# Patient Record
Sex: Male | Born: 1960
Health system: Southern US, Community
[De-identification: ages and names within clinical notes are randomized; demographics above are authoritative.]

## PROBLEM LIST (undated history)

## (undated) DIAGNOSIS — R7989 Other specified abnormal findings of blood chemistry: Secondary | ICD-10-CM

## (undated) DIAGNOSIS — IMO0001 Reserved for inherently not codable concepts without codable children: Secondary | ICD-10-CM

## (undated) DIAGNOSIS — I10 Essential (primary) hypertension: Secondary | ICD-10-CM

## (undated) DIAGNOSIS — M1990 Unspecified osteoarthritis, unspecified site: Secondary | ICD-10-CM

## (undated) DIAGNOSIS — E7800 Pure hypercholesterolemia, unspecified: Secondary | ICD-10-CM

## (undated) DIAGNOSIS — I2510 Atherosclerotic heart disease of native coronary artery without angina pectoris: Secondary | ICD-10-CM

## (undated) DIAGNOSIS — M109 Gout, unspecified: Secondary | ICD-10-CM

## (undated) DIAGNOSIS — G4730 Sleep apnea, unspecified: Secondary | ICD-10-CM

## (undated) HISTORY — PX: COLONOSCOPY W/ POLYPECTOMY: SHX1380

## (undated) HISTORY — PX: TONSILLECTOMY: SUR1361

## (undated) HISTORY — PX: INSERT / REPLACE / REMOVE PACEMAKER: SUR710

## (undated) HISTORY — PX: UVULOPALATOPHARYNGOPLASTY: SHX827

## (undated) HISTORY — PX: CHOLECYSTECTOMY: SHX55

---

## 1994-02-09 HISTORY — PX: HAND SURGERY: SHX662

## 2003-05-31 ENCOUNTER — Other Ambulatory Visit: Payer: Self-pay

## 2004-01-04 ENCOUNTER — Other Ambulatory Visit: Payer: Self-pay

## 2004-01-09 ENCOUNTER — Inpatient Hospital Stay: Payer: Self-pay | Admitting: Otolaryngology

## 2004-04-16 ENCOUNTER — Ambulatory Visit: Payer: Self-pay | Admitting: Otolaryngology

## 2004-07-17 ENCOUNTER — Emergency Department: Payer: Self-pay | Admitting: General Practice

## 2004-07-18 ENCOUNTER — Inpatient Hospital Stay: Payer: Self-pay | Admitting: Surgery

## 2004-07-25 ENCOUNTER — Ambulatory Visit: Payer: Self-pay | Admitting: Surgery

## 2004-08-21 ENCOUNTER — Ambulatory Visit: Payer: Self-pay | Admitting: Surgery

## 2004-08-26 ENCOUNTER — Other Ambulatory Visit: Payer: Self-pay

## 2004-08-28 ENCOUNTER — Ambulatory Visit: Payer: Self-pay | Admitting: Surgery

## 2005-03-22 ENCOUNTER — Other Ambulatory Visit: Payer: Self-pay

## 2005-03-22 ENCOUNTER — Emergency Department: Payer: Self-pay | Admitting: General Practice

## 2005-10-20 ENCOUNTER — Ambulatory Visit: Payer: Self-pay | Admitting: Internal Medicine

## 2005-12-17 ENCOUNTER — Ambulatory Visit: Payer: Self-pay | Admitting: Internal Medicine

## 2006-07-30 ENCOUNTER — Ambulatory Visit: Payer: Self-pay | Admitting: Internal Medicine

## 2007-06-27 ENCOUNTER — Ambulatory Visit: Payer: Self-pay | Admitting: Gastroenterology

## 2008-09-27 ENCOUNTER — Ambulatory Visit: Payer: Self-pay | Admitting: Internal Medicine

## 2010-03-13 ENCOUNTER — Ambulatory Visit: Payer: Self-pay

## 2012-05-20 ENCOUNTER — Ambulatory Visit: Payer: Self-pay | Admitting: Gastroenterology

## 2012-07-26 ENCOUNTER — Ambulatory Visit: Payer: Self-pay | Admitting: Physical Medicine and Rehabilitation

## 2012-12-21 ENCOUNTER — Ambulatory Visit: Payer: Self-pay | Admitting: Internal Medicine

## 2013-01-19 ENCOUNTER — Other Ambulatory Visit: Payer: Self-pay | Admitting: Neurosurgery

## 2013-01-19 DIAGNOSIS — M48061 Spinal stenosis, lumbar region without neurogenic claudication: Secondary | ICD-10-CM

## 2013-01-27 ENCOUNTER — Ambulatory Visit
Admission: RE | Admit: 2013-01-27 | Discharge: 2013-01-27 | Disposition: A | Discharge status: Home / Self Care | Payer: Managed Care, Other (non HMO) | Source: Ambulatory Visit | Attending: Neurosurgery | Admitting: Neurosurgery

## 2013-01-27 VITALS — BP 131/66 | HR 61

## 2013-01-27 DIAGNOSIS — M48061 Spinal stenosis, lumbar region without neurogenic claudication: Secondary | ICD-10-CM

## 2013-01-27 MED ORDER — IOHEXOL 180 MG/ML  SOLN
18.0000 mL | Freq: Once | INTRAMUSCULAR | Status: AC | PRN
  Administered 2013-01-27: 18 mL via INTRATHECAL

## 2013-01-27 MED ORDER — DIAZEPAM 5 MG PO TABS
10.0000 mg | ORAL_TABLET | Freq: Once | ORAL | Status: AC
  Administered 2013-01-27: 10 mg via ORAL

## 2013-01-27 MED ORDER — ONDANSETRON HCL 4 MG/2ML IJ SOLN: 4.0000 mg | Freq: Four times a day (QID) | INTRAMUSCULAR | Status: DC | PRN

## 2013-06-30 ENCOUNTER — Emergency Department: Payer: Self-pay | Admitting: Emergency Medicine

## 2013-06-30 LAB — CBC
HCT: 43.3 % (ref 40.0–52.0)
HGB: 15 g/dL (ref 13.0–18.0)
MCH: 32.4 pg (ref 26.0–34.0)
MCHC: 34.8 g/dL (ref 32.0–36.0)
MCV: 93 fL (ref 80–100)
Platelet: 211 10*3/uL (ref 150–440)
RBC: 4.64 10*6/uL (ref 4.40–5.90)
RDW: 13.4 % (ref 11.5–14.5)
WBC: 8.3 10*3/uL (ref 3.8–10.6)

## 2013-06-30 LAB — COMPREHENSIVE METABOLIC PANEL
ALK PHOS: 74 U/L
Albumin: 4.2 g/dL (ref 3.4–5.0)
Anion Gap: 9 (ref 7–16)
BUN: 9 mg/dL (ref 7–18)
Bilirubin,Total: 0.7 mg/dL (ref 0.2–1.0)
CHLORIDE: 102 mmol/L (ref 98–107)
CO2: 25 mmol/L (ref 21–32)
CREATININE: 0.84 mg/dL (ref 0.60–1.30)
Calcium, Total: 9.2 mg/dL (ref 8.5–10.1)
EGFR (Non-African Amer.): >60
Glucose: 120 mg/dL — ABNORMAL HIGH (ref 65–99)
Osmolality: 272 (ref 275–301)
Potassium: 4.1 mmol/L (ref 3.5–5.1)
SGOT(AST): 34 U/L (ref 15–37)
SGPT (ALT): 52 U/L (ref 12–78)
Sodium: 136 mmol/L (ref 136–145)
Total Protein: 8 g/dL (ref 6.4–8.2)

## 2013-06-30 LAB — PRO B NATRIURETIC PEPTIDE: B-TYPE NATIURETIC PEPTID: 27 pg/mL (ref 0–125)

## 2013-06-30 LAB — CK: CK, TOTAL: 166 U/L

## 2013-06-30 LAB — TROPONIN I: Troponin-I: <0.02 ng/mL

## 2015-03-11 ENCOUNTER — Other Ambulatory Visit: Payer: Self-pay | Admitting: Internal Medicine

## 2015-03-11 DIAGNOSIS — R0609 Other forms of dyspnea: Secondary | ICD-10-CM

## 2015-03-14 ENCOUNTER — Ambulatory Visit
Admission: RE | Admit: 2015-03-14 | Discharge: 2015-03-14 | Disposition: A | Discharge status: Home / Self Care | Payer: Commercial Managed Care - HMO | Source: Ambulatory Visit | Attending: Internal Medicine | Admitting: Internal Medicine

## 2015-03-14 DIAGNOSIS — I2510 Atherosclerotic heart disease of native coronary artery without angina pectoris: Secondary | ICD-10-CM | POA: Diagnosis not present

## 2015-03-14 DIAGNOSIS — I517 Cardiomegaly: Secondary | ICD-10-CM | POA: Diagnosis not present

## 2015-03-14 DIAGNOSIS — R918 Other nonspecific abnormal finding of lung field: Secondary | ICD-10-CM | POA: Insufficient documentation

## 2015-03-14 DIAGNOSIS — R0609 Other forms of dyspnea: Secondary | ICD-10-CM

## 2015-03-14 DIAGNOSIS — Z87891 Personal history of nicotine dependence: Secondary | ICD-10-CM | POA: Diagnosis not present

## 2015-03-14 HISTORY — DX: Essential (primary) hypertension: I10

## 2015-03-14 MED ORDER — IOHEXOL 350 MG/ML SOLN
100.0000 mL | Freq: Once | INTRAVENOUS | Status: AC | PRN
  Administered 2015-03-14: 100 mL via INTRAVENOUS

## 2015-03-15 DIAGNOSIS — Q245 Malformation of coronary vessels: Secondary | ICD-10-CM | POA: Insufficient documentation

## 2015-03-17 ENCOUNTER — Emergency Department: Payer: Commercial Managed Care - HMO

## 2015-03-17 ENCOUNTER — Emergency Department
Admission: EM | Admit: 2015-03-17 | Discharge: 2015-03-17 | Disposition: A | Discharge status: Home / Self Care | Payer: Commercial Managed Care - HMO | Attending: Emergency Medicine | Admitting: Emergency Medicine

## 2015-03-17 ENCOUNTER — Encounter: Payer: Self-pay | Admitting: Emergency Medicine

## 2015-03-17 DIAGNOSIS — R079 Chest pain, unspecified: Secondary | ICD-10-CM

## 2015-03-17 DIAGNOSIS — I10 Essential (primary) hypertension: Secondary | ICD-10-CM | POA: Insufficient documentation

## 2015-03-17 DIAGNOSIS — Z87891 Personal history of nicotine dependence: Secondary | ICD-10-CM | POA: Insufficient documentation

## 2015-03-17 DIAGNOSIS — R0602 Shortness of breath: Secondary | ICD-10-CM | POA: Diagnosis not present

## 2015-03-17 DIAGNOSIS — I2510 Atherosclerotic heart disease of native coronary artery without angina pectoris: Secondary | ICD-10-CM | POA: Diagnosis not present

## 2015-03-17 DIAGNOSIS — I519 Heart disease, unspecified: Secondary | ICD-10-CM | POA: Insufficient documentation

## 2015-03-17 HISTORY — DX: Pure hypercholesterolemia, unspecified: E78.00

## 2015-03-17 HISTORY — DX: Atherosclerotic heart disease of native coronary artery without angina pectoris: I25.10

## 2015-03-17 LAB — TROPONIN I: Troponin I: <0.03 ng/mL (ref <0.031)

## 2015-03-17 LAB — BASIC METABOLIC PANEL
Anion gap: 10 (ref 5–15)
BUN: 14 mg/dL (ref 6–20)
CHLORIDE: 102 mmol/L (ref 101–111)
CO2: 23 mmol/L (ref 22–32)
Calcium: 8.6 mg/dL — ABNORMAL LOW (ref 8.9–10.3)
Creatinine, Ser: 0.87 mg/dL (ref 0.61–1.24)
GFR calc non Af Amer: >60 mL/min (ref >60)
Glucose, Bld: 164 mg/dL — ABNORMAL HIGH (ref 65–99)
Potassium: 3.3 mmol/L — ABNORMAL LOW (ref 3.5–5.1)
SODIUM: 135 mmol/L (ref 135–145)

## 2015-03-17 LAB — CBC
HCT: 39.9 % — ABNORMAL LOW (ref 40.0–52.0)
Hemoglobin: 13.7 g/dL (ref 13.0–18.0)
MCH: 31.7 pg (ref 26.0–34.0)
MCHC: 34.4 g/dL (ref 32.0–36.0)
MCV: 92 fL (ref 80.0–100.0)
Platelets: 179 10*3/uL (ref 150–440)
RBC: 4.33 MIL/uL — AB (ref 4.40–5.90)
RDW: 13.3 % (ref 11.5–14.5)
WBC: 6.6 10*3/uL (ref 3.8–10.6)

## 2015-03-17 MED ORDER — ISOSORBIDE MONONITRATE ER 30 MG PO TB24: 30.0000 mg | ORAL_TABLET | Freq: Every day | ORAL | Status: DC

## 2015-03-17 MED ORDER — NITROGLYCERIN 0.4 MG SL SUBL
0.4000 mg | SUBLINGUAL_TABLET | SUBLINGUAL | Status: DC | PRN
  Administered 2015-03-17 (×3): 0.4 mg via SUBLINGUAL
  Filled 2015-03-17 (×3): qty 1

## 2015-03-17 MED ORDER — SODIUM CHLORIDE 0.9 % IV BOLUS (SEPSIS)
1000.0000 mL | Freq: Once | INTRAVENOUS | Status: AC
  Administered 2015-03-17: 1000 mL via INTRAVENOUS

## 2015-03-17 MED ORDER — ASPIRIN 81 MG PO CHEW
324.0000 mg | CHEWABLE_TABLET | Freq: Once | ORAL | Status: AC
  Administered 2015-03-17: 324 mg via ORAL
  Filled 2015-03-17: qty 4

## 2015-03-17 NOTE — ED Notes (Signed)
Pt says he's had respiratory issues for about 2 weeks; was called Friday by his MD and told he has CAD; pt presents tonight with pain to his left chest that started about 1 hour ago; says when he turned to his side the pain moved to the center of his chest; nausea but no vomiting; short of breath all the time, worse with ambulation

## 2015-03-17 NOTE — ED Notes (Signed)
Patient returned from XR. 

## 2015-03-17 NOTE — Discharge Instructions (Signed)
Nonspecific Chest Pain  °Chest pain can be caused by many different conditions. There is always a chance that your pain could be related to something serious, such as a heart attack or a blood clot in your lungs. Chest pain can also be caused by conditions that are not life-threatening. If you have chest pain, it is very important to follow up with your health care provider. °CAUSES  °Chest pain can be caused by: °· Heartburn. °· Pneumonia or bronchitis. °· Anxiety or stress. °· Inflammation around your heart (pericarditis) or lung (pleuritis or pleurisy). °· A blood clot in your lung. °· A collapsed lung (pneumothorax). It can develop suddenly on its own (spontaneous pneumothorax) or from trauma to the chest. °· Shingles infection (varicella-zoster virus). °· Heart attack. °· Damage to the bones, muscles, and cartilage that make up your chest wall. This can include: °¨ Bruised bones due to injury. °¨ Strained muscles or cartilage due to frequent or repeated coughing or overwork. °¨ Fracture to one or more ribs. °¨ Sore cartilage due to inflammation (costochondritis). °RISK FACTORS  °Risk factors for chest pain may include: °· Activities that increase your risk for trauma or injury to your chest. °· Respiratory infections or conditions that cause frequent coughing. °· Medical conditions or overeating that can cause heartburn. °· Heart disease or family history of heart disease. °· Conditions or health behaviors that increase your risk of developing a blood clot. °· Having had chicken pox (varicella zoster). °SIGNS AND SYMPTOMS °Chest pain can feel like: °· Burning or tingling on the surface of your chest or deep in your chest. °· Crushing, pressure, aching, or squeezing pain. °· Dull or sharp pain that is worse when you move, cough, or take a deep breath. °· Pain that is also felt in your back, neck, shoulder, or arm, or pain that spreads to any of these areas. °Your chest pain may come and go, or it may stay  constant. °DIAGNOSIS °Lab tests or other studies may be needed to find the cause of your pain. Your health care provider may have you take a test called an ambulatory ECG (electrocardiogram). An ECG records your heartbeat patterns at the time the test is performed. You may also have other tests, such as: °· Transthoracic echocardiogram (TTE). During echocardiography, sound waves are used to create a picture of all of the heart structures and to look at how blood flows through your heart. °· Transesophageal echocardiogram (TEE). This is a more advanced imaging test that obtains images from inside your body. It allows your health care provider to see your heart in finer detail. °· Cardiac monitoring. This allows your health care provider to monitor your heart rate and rhythm in real time. °· Holter monitor. This is a portable device that records your heartbeat and can help to diagnose abnormal heartbeats. It allows your health care provider to track your heart activity for several days, if needed. °· Stress tests. These can be done through exercise or by taking medicine that makes your heart beat more quickly. °· Blood tests. °· Imaging tests. °TREATMENT  °Your treatment depends on what is causing your chest pain. Treatment may include: °· Medicines. These may include: °¨ Acid blockers for heartburn. °¨ Anti-inflammatory medicine. °¨ Pain medicine for inflammatory conditions. °¨ Antibiotic medicine, if an infection is present. °¨ Medicines to dissolve blood clots. °¨ Medicines to treat coronary artery disease. °· Supportive care for conditions that do not require medicines. This may include: °¨ Resting. °¨ Applying heat   or cold packs to injured areas. °¨ Limiting activities until pain decreases. °HOME CARE INSTRUCTIONS °· If you were prescribed an antibiotic medicine, finish it all even if you start to feel better. °· Avoid any activities that bring on chest pain. °· Do not use any tobacco products, including  cigarettes, chewing tobacco, or electronic cigarettes. If you need help quitting, ask your health care provider. °· Do not drink alcohol. °· Take medicines only as directed by your health care provider. °· Keep all follow-up visits as directed by your health care provider. This is important. This includes any further testing if your chest pain does not go away. °· If heartburn is the cause for your chest pain, you may be told to keep your head raised (elevated) while sleeping. This reduces the chance that acid will go from your stomach into your esophagus. °· Make lifestyle changes as directed by your health care provider. These may include: °¨ Getting regular exercise. Ask your health care provider to suggest some activities that are safe for you. °¨ Eating a heart-healthy diet. A registered dietitian can help you to learn healthy eating options. °¨ Maintaining a healthy weight. °¨ Managing diabetes, if necessary. °¨ Reducing stress. °SEEK MEDICAL CARE IF: °· Your chest pain does not go away after treatment. °· You have a rash with blisters on your chest. °· You have a fever. °SEEK IMMEDIATE MEDICAL CARE IF:  °· Your chest pain is worse. °· You have an increasing cough, or you cough up blood. °· You have severe abdominal pain. °· You have severe weakness. °· You faint. °· You have chills. °· You have sudden, unexplained chest discomfort. °· You have sudden, unexplained discomfort in your arms, back, neck, or jaw. °· You have shortness of breath at any time. °· You suddenly start to sweat, or your skin gets clammy. °· You feel nauseous or you vomit. °· You suddenly feel light-headed or dizzy. °· Your heart begins to beat quickly, or it feels like it is skipping beats. °These symptoms may represent a serious problem that is an emergency. Do not wait to see if the symptoms will go away. Get medical help right away. Call your local emergency services (911 in the [place] Do not drive yourself to the hospital. °  °This  information is not intended to replace advice given to you by your health care provider. Make sure you discuss any questions you have with your health care provider. °  °Document Released: 11/05/2004 Document Revised: 02/16/2014 Document Reviewed: 09/01/2013 °Elsevier Interactive Patient Education ©2016 [employer] ° °

## 2015-03-17 NOTE — ED Notes (Signed)
Patient in distress and increased WOB, placing in right AC.

## 2015-03-17 NOTE — ED Notes (Signed)
Patient transported to X-ray 

## 2015-03-17 NOTE — ED Provider Notes (Signed)
[facility] Emergency Department Provider Note  ____________________________________________  Time seen: Approximately 353 AM  I have reviewed the triage vital signs and the nursing notes.   HISTORY  Chief Complaint Chest Pain    HPI Jacob Rose is a 55 y.o. male who comes into the hospital today with chest pain. The patient reports he woke up about 2 AM and felt nauseous and felt as though he couldn't breathe. He rolled onto his left side and developed some middle chest pain. He reports that he was told by his doctor that he had coronary heart disease and he needs to see Dr. Satira Mccallum. He reports that he has been having some chest pressure for multiple days and shortness of breath for 2 weeks which prompted his doctor's visit. The patient reports that he does not wear oxygen at home. He still having some chest pressure he reports a 7 out of 10 in intensity. He did not take any medications at home for this pain but did take his home normal medicines before he went to bed. The patient has some left arm radiation, shortness of breath, nausea with no dizziness or lightheadedness. The patient did not tolerate the symptoms and he decided to come in and get checked out.   Past Medical History  Diagnosis Date  . Hypertension   . Coronary artery disease   . Hypercholesteremia     There are no active problems to display for this patient.   Past Surgical History  Procedure Laterality Date  . Hand surgery Left   . Uvulopalatopharyngoplasty    . Tonsillectomy      Current Outpatient Rx  Name  Route  Sig  Dispense  Refill  . isosorbide mononitrate (IMDUR) 30 MG 24 hr tablet   Oral   Take 1 tablet (30 mg total) by mouth daily.   15 tablet   0     Allergies Review of patient's allergies indicates no known allergies.  History reviewed. No pertinent family history.  Social History Social History  Substance Use Topics  . Smoking status: Former Research scientist (life sciences)  .  Smokeless tobacco: Current User    Types: Chew  . Alcohol Use: Yes     Comment: 6 pack last night    Review of Systems Constitutional: No fever/chills Eyes: No visual changes. ENT: No sore throat. Cardiovascular:  chest pain. Respiratory: shortness of breath. Gastrointestinal: Nausea with No abdominal pain.  no vomiting.  No diarrhea.  No constipation. Genitourinary: Negative for dysuria. Musculoskeletal: Negative for back pain. Skin: Negative for rash. Neurological: Negative for headaches, focal weakness or numbness.  10-point ROS otherwise negative.  ____________________________________________   PHYSICAL EXAM:  VITAL SIGNS: ED Triage Vitals  Enc Vitals Group     BP 03/17/15 0326 142/79 mmHg     Pulse Rate 03/17/15 0326 85     Resp 03/17/15 0326 24     Temp 03/17/15 0326 97.5 F (36.4 C)     Temp Source 03/17/15 0326 Oral     SpO2 03/17/15 0326 100 %     Weight 03/17/15 0326 223 lb (101.152 kg)     Height 03/17/15 0326 5\' 6"  (1.676 m)     Head Cir --      Peak Flow --      Pain Score 03/17/15 0326 7     Pain Loc --      Pain Edu? --      Excl. in [place]? --     Constitutional: Alert and  oriented. Well appearing and in moderate distress. Eyes: Conjunctivae are normal. PERRL. EOMI. Head: Atraumatic. Nose: No congestion/rhinnorhea. Mouth/Throat: Mucous membranes are moist.  Oropharynx non-erythematous. Cardiovascular: Normal rate, regular rhythm. Grossly normal heart sounds.  Good peripheral circulation. Respiratory: Normal respiratory effort.  No retractions. Lungs CTAB. Gastrointestinal: Soft and nontender. No distention. Positive bowel sounds Musculoskeletal: No lower extremity tenderness nor edema.   Neurologic:  Normal speech and language.  Skin:  Skin is warm, dry and intact.  Psychiatric: Mood and affect are normal.   ____________________________________________   LABS (all labs ordered are listed, but only abnormal results are displayed)  Labs  Reviewed  BASIC METABOLIC PANEL - Abnormal; Notable for the following:    Potassium 3.3 (*)    Glucose, Bld 164 (*)    Calcium 8.6 (*)    All other components within normal limits  CBC - Abnormal; Notable for the following:    RBC 4.33 (*)    HCT 39.9 (*)    All other components within normal limits  TROPONIN I  TROPONIN I   ____________________________________________  EKG  ED ECG REPORT I, Loney Hering, the attending physician, personally viewed and interpreted this ECG.   Date: 03/17/2015  EKG Time: 322  Rate: 79  Rhythm: normal sinus rhythm  Axis: Normal  Intervals:none  ST&T Change: None  ____________________________________________  RADIOLOGY  Chest x-ray: No evidence of active disease ____________________________________________   PROCEDURES  Procedure(s) performed: None  Critical Care performed: No  ____________________________________________   INITIAL IMPRESSION / ASSESSMENT AND PLAN / ED COURSE  Pertinent labs & imaging results that were available during my care of the patient were reviewed by me and considered in my medical decision making (see chart for details).  This is a 55 year old male who comes into the hospital today with some chest pain and shortness of breath. The patient reports that he was told he does have some coronary artery disease. I will give the patient some nitroglycerin and normal saline as he is continuing to have chest pain. I will also give the patient some aspirin. I will reassess the patient once I received all the results of his blood work.  The patient's chest pain resolved after 2 nitroglycerin. The patient's cardiac enzymes were unremarkable 2 doses. He continues to have no chest pain at this time. I contacted Dr.Paraschos who was the cardiologist on-call and asked if Imdur would be an appropriate medication to help this patient's chest pain. He reports that he feels it is appropriate to give the patient Imdur and  to have him follow-up in the office this week. I expect this to the patient and he also agrees the plan. The patient be discharged home to follow-up with cardiology. ____________________________________________   FINAL CLINICAL IMPRESSION(S) / ED DIAGNOSES  Final diagnoses:  Chest pain, unspecified chest pain type      Loney Hering, MD 03/17/15 [phone]

## 2015-03-18 ENCOUNTER — Other Ambulatory Visit: Payer: Self-pay | Admitting: Cardiology

## 2015-03-19 ENCOUNTER — Encounter
Admission: RE | Disposition: A | Discharge status: Home / Self Care | Payer: Self-pay | Source: Ambulatory Visit | Attending: Cardiology

## 2015-03-19 ENCOUNTER — Ambulatory Visit
Admission: RE | Admit: 2015-03-19 | Discharge: 2015-03-19 | Disposition: A | Discharge status: Home / Self Care | Payer: 59 | Source: Ambulatory Visit | Attending: Cardiology | Admitting: Cardiology

## 2015-03-19 ENCOUNTER — Encounter: Payer: Self-pay | Admitting: *Deleted

## 2015-03-19 DIAGNOSIS — R0600 Dyspnea, unspecified: Secondary | ICD-10-CM | POA: Diagnosis not present

## 2015-03-19 DIAGNOSIS — R0789 Other chest pain: Secondary | ICD-10-CM | POA: Insufficient documentation

## 2015-03-19 DIAGNOSIS — K760 Fatty (change of) liver, not elsewhere classified: Secondary | ICD-10-CM | POA: Diagnosis not present

## 2015-03-19 DIAGNOSIS — E785 Hyperlipidemia, unspecified: Secondary | ICD-10-CM | POA: Insufficient documentation

## 2015-03-19 DIAGNOSIS — G4730 Sleep apnea, unspecified: Secondary | ICD-10-CM | POA: Diagnosis not present

## 2015-03-19 DIAGNOSIS — Z86010 Personal history of colonic polyps: Secondary | ICD-10-CM | POA: Insufficient documentation

## 2015-03-19 DIAGNOSIS — R0602 Shortness of breath: Secondary | ICD-10-CM | POA: Diagnosis present

## 2015-03-19 DIAGNOSIS — E291 Testicular hypofunction: Secondary | ICD-10-CM | POA: Diagnosis not present

## 2015-03-19 DIAGNOSIS — Z801 Family history of malignant neoplasm of trachea, bronchus and lung: Secondary | ICD-10-CM | POA: Insufficient documentation

## 2015-03-19 DIAGNOSIS — E559 Vitamin D deficiency, unspecified: Secondary | ICD-10-CM | POA: Diagnosis not present

## 2015-03-19 DIAGNOSIS — I10 Essential (primary) hypertension: Secondary | ICD-10-CM | POA: Diagnosis not present

## 2015-03-19 DIAGNOSIS — Z8249 Family history of ischemic heart disease and other diseases of the circulatory system: Secondary | ICD-10-CM | POA: Insufficient documentation

## 2015-03-19 DIAGNOSIS — I2510 Atherosclerotic heart disease of native coronary artery without angina pectoris: Secondary | ICD-10-CM | POA: Diagnosis not present

## 2015-03-19 DIAGNOSIS — Z825 Family history of asthma and other chronic lower respiratory diseases: Secondary | ICD-10-CM | POA: Insufficient documentation

## 2015-03-19 DIAGNOSIS — M1990 Unspecified osteoarthritis, unspecified site: Secondary | ICD-10-CM | POA: Insufficient documentation

## 2015-03-19 HISTORY — DX: Reserved for inherently not codable concepts without codable children: IMO0001

## 2015-03-19 HISTORY — PX: CARDIAC CATHETERIZATION: SHX172

## 2015-03-19 SURGERY — RIGHT/LEFT HEART CATH AND CORONARY ANGIOGRAPHY
Anesthesia: Moderate Sedation

## 2015-03-19 MED ORDER — MIDAZOLAM HCL 2 MG/2ML IJ SOLN
INTRAMUSCULAR | Status: AC
  Filled 2015-03-19: qty 2

## 2015-03-19 MED ORDER — ASPIRIN 81 MG PO CHEW: 81.0000 mg | CHEWABLE_TABLET | ORAL | Status: DC

## 2015-03-19 MED ORDER — FENTANYL CITRATE (PF) 100 MCG/2ML IJ SOLN
INTRAMUSCULAR | Status: AC
  Filled 2015-03-19: qty 2

## 2015-03-19 MED ORDER — FENTANYL CITRATE (PF) 100 MCG/2ML IJ SOLN
INTRAMUSCULAR | Status: DC | PRN
  Administered 2015-03-19: 50 ug via INTRAVENOUS

## 2015-03-19 MED ORDER — SODIUM CHLORIDE 0.9 % WEIGHT BASED INFUSION: 1.0000 mL/kg/h | INTRAVENOUS | Status: DC

## 2015-03-19 MED ORDER — SODIUM CHLORIDE 0.9 % WEIGHT BASED INFUSION: 3.0000 mL/kg/h | INTRAVENOUS | Status: DC

## 2015-03-19 MED ORDER — IOHEXOL 300 MG/ML  SOLN
INTRAMUSCULAR | Status: DC | PRN
  Administered 2015-03-19: 100 mL via INTRA_ARTERIAL

## 2015-03-19 MED ORDER — MIDAZOLAM HCL 2 MG/2ML IJ SOLN
INTRAMUSCULAR | Status: DC | PRN
  Administered 2015-03-19: 1 mg via INTRAVENOUS

## 2015-03-19 MED ORDER — SODIUM CHLORIDE 0.9% FLUSH: 3.0000 mL | Freq: Two times a day (BID) | INTRAVENOUS | Status: DC

## 2015-03-19 MED ORDER — SODIUM CHLORIDE 0.9 % IV SOLN: 250.0000 mL | INTRAVENOUS | Status: DC | PRN

## 2015-03-19 MED ORDER — SODIUM CHLORIDE 0.9% FLUSH: 3.0000 mL | INTRAVENOUS | Status: DC | PRN

## 2015-03-19 MED ORDER — HEPARIN (PORCINE) IN NACL 2-0.9 UNIT/ML-% IJ SOLN
INTRAMUSCULAR | Status: AC
  Filled 2015-03-19: qty 1000

## 2015-03-19 SURGICAL SUPPLY — 13 items
CATH INFINITI 5FR ANG PIGTAIL (CATHETERS) ×2 IMPLANT
CATH INFINITI 5FR JL4 (CATHETERS) ×2 IMPLANT
CATH INFINITI JR4 5F (CATHETERS) ×2 IMPLANT
CATH SWANZ 7F THERMO (CATHETERS) ×2 IMPLANT
DEVICE CLOSURE MYNXGRIP 5F (Vascular Products) ×2 IMPLANT
GUIDEWIRE EMER 3M J .025X150CM (WIRE) ×2 IMPLANT
KIT MANI 3VAL PERCEP (MISCELLANEOUS) ×2 IMPLANT
KIT RIGHT HEART (MISCELLANEOUS) ×2 IMPLANT
NEEDLE PERC 18GX7CM (NEEDLE) ×2 IMPLANT
PACK CARDIAC CATH (CUSTOM PROCEDURE TRAY) ×2 IMPLANT
SHEATH PINNACLE 5F 10CM (SHEATH) ×2 IMPLANT
SHEATH PINNACLE 7F 10CM (SHEATH) ×2 IMPLANT
WIRE EMERALD 3MM-J .035X150CM (WIRE) ×2 IMPLANT
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
## 2015-03-20 ENCOUNTER — Encounter: Payer: Self-pay | Admitting: Cardiology

## 2016-04-13 DIAGNOSIS — R0602 Shortness of breath: Secondary | ICD-10-CM | POA: Diagnosis not present

## 2016-04-13 DIAGNOSIS — G4733 Obstructive sleep apnea (adult) (pediatric): Secondary | ICD-10-CM | POA: Diagnosis not present

## 2016-04-28 DIAGNOSIS — M1000 Idiopathic gout, unspecified site: Secondary | ICD-10-CM | POA: Diagnosis not present

## 2016-04-28 DIAGNOSIS — E559 Vitamin D deficiency, unspecified: Secondary | ICD-10-CM | POA: Diagnosis not present

## 2016-04-28 DIAGNOSIS — I10 Essential (primary) hypertension: Secondary | ICD-10-CM | POA: Diagnosis not present

## 2016-04-28 DIAGNOSIS — G4733 Obstructive sleep apnea (adult) (pediatric): Secondary | ICD-10-CM | POA: Diagnosis not present

## 2016-05-01 DIAGNOSIS — I519 Heart disease, unspecified: Secondary | ICD-10-CM | POA: Diagnosis not present

## 2016-05-01 DIAGNOSIS — I10 Essential (primary) hypertension: Secondary | ICD-10-CM | POA: Diagnosis not present

## 2016-05-01 DIAGNOSIS — G4733 Obstructive sleep apnea (adult) (pediatric): Secondary | ICD-10-CM | POA: Diagnosis not present

## 2016-07-10 ENCOUNTER — Encounter: Payer: Self-pay | Admitting: Gynecology

## 2016-07-10 ENCOUNTER — Ambulatory Visit
Admission: EM | Admit: 2016-07-10 | Discharge: 2016-07-10 | Disposition: A | Discharge status: Home / Self Care | Payer: Worker's Compensation | Attending: Internal Medicine | Admitting: Internal Medicine

## 2016-07-10 ENCOUNTER — Ambulatory Visit (INDEPENDENT_AMBULATORY_CARE_PROVIDER_SITE_OTHER): Payer: Worker's Compensation

## 2016-07-10 DIAGNOSIS — S46912A Strain of unspecified muscle, fascia and tendon at shoulder and upper arm level, left arm, initial encounter: Secondary | ICD-10-CM

## 2016-07-10 DIAGNOSIS — W19XXXA Unspecified fall, initial encounter: Secondary | ICD-10-CM | POA: Diagnosis not present

## 2016-07-10 DIAGNOSIS — M79602 Pain in left arm: Secondary | ICD-10-CM

## 2016-07-10 MED ORDER — ACETAMINOPHEN 325 MG PO TABS
650.0000 mg | ORAL_TABLET | Freq: Once | ORAL | Status: AC
  Administered 2016-07-10: 650 mg via ORAL

## 2016-07-10 NOTE — ED Triage Notes (Signed)
Per patient fell x today at the back of a truck. Per patient fell onto his left arm. Pt. C/o left arm pain.
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
## 2016-07-10 NOTE — Discharge Instructions (Signed)
Your xray showed Osteoarthritis no acute findings. Rest,ice to affected area. Wear shoulder sling for support, remove sling and perform ROM exercises several times daily to avoid frozen shoulder. Take ibuprofen 600 mg po every 8 hours for pain. Follow up with ([facility]) next week or check with [facility] and follow up with their recommended Orthopedic specialist for clearance to return to full duty

## 2016-08-04 DIAGNOSIS — E559 Vitamin D deficiency, unspecified: Secondary | ICD-10-CM | POA: Diagnosis not present

## 2016-08-04 DIAGNOSIS — I10 Essential (primary) hypertension: Secondary | ICD-10-CM | POA: Diagnosis not present

## 2016-08-04 DIAGNOSIS — R739 Hyperglycemia, unspecified: Secondary | ICD-10-CM | POA: Diagnosis not present

## 2016-08-04 DIAGNOSIS — R7989 Other specified abnormal findings of blood chemistry: Secondary | ICD-10-CM | POA: Diagnosis not present

## 2016-08-10 DIAGNOSIS — E349 Endocrine disorder, unspecified: Secondary | ICD-10-CM | POA: Diagnosis not present

## 2016-08-10 DIAGNOSIS — I10 Essential (primary) hypertension: Secondary | ICD-10-CM | POA: Diagnosis not present

## 2016-08-10 DIAGNOSIS — E559 Vitamin D deficiency, unspecified: Secondary | ICD-10-CM | POA: Diagnosis not present

## 2016-11-02 ENCOUNTER — Emergency Department
Admission: EM | Admit: 2016-11-02 | Discharge: 2016-11-02 | Disposition: A | Discharge status: Home / Self Care | Payer: 59 | Attending: Emergency Medicine | Admitting: Emergency Medicine

## 2016-11-02 DIAGNOSIS — I110 Hypertensive heart disease with heart failure: Secondary | ICD-10-CM | POA: Diagnosis not present

## 2016-11-02 DIAGNOSIS — Y929 Unspecified place or not applicable: Secondary | ICD-10-CM | POA: Diagnosis not present

## 2016-11-02 DIAGNOSIS — S61412A Laceration without foreign body of left hand, initial encounter: Secondary | ICD-10-CM

## 2016-11-02 DIAGNOSIS — F17220 Nicotine dependence, chewing tobacco, uncomplicated: Secondary | ICD-10-CM | POA: Diagnosis not present

## 2016-11-02 DIAGNOSIS — I5030 Unspecified diastolic (congestive) heart failure: Secondary | ICD-10-CM | POA: Insufficient documentation

## 2016-11-02 DIAGNOSIS — W268XXA Contact with other sharp object(s), not elsewhere classified, initial encounter: Secondary | ICD-10-CM | POA: Insufficient documentation

## 2016-11-02 DIAGNOSIS — Y998 Other external cause status: Secondary | ICD-10-CM | POA: Diagnosis not present

## 2016-11-02 DIAGNOSIS — Y9389 Activity, other specified: Secondary | ICD-10-CM | POA: Diagnosis not present

## 2016-11-02 DIAGNOSIS — I2510 Atherosclerotic heart disease of native coronary artery without angina pectoris: Secondary | ICD-10-CM | POA: Insufficient documentation

## 2016-11-02 DIAGNOSIS — Z79899 Other long term (current) drug therapy: Secondary | ICD-10-CM | POA: Insufficient documentation

## 2016-11-02 MED ORDER — BACITRACIN ZINC 500 UNIT/GM EX OINT
TOPICAL_OINTMENT | Freq: Once | CUTANEOUS | Status: AC
  Administered 2016-11-02: 1 via TOPICAL

## 2016-11-02 MED ORDER — BACITRACIN ZINC 500 UNIT/GM EX OINT
TOPICAL_OINTMENT | CUTANEOUS | Status: DC
Start: 2016-11-02 — End: 2016-11-02
  Filled 2016-11-02: qty 0.9

## 2016-11-02 NOTE — ED Triage Notes (Signed)
Patient cut right hand proximal to 5th digit, palmar aspect yesterday at approx 1500 on an oil filter.

## 2016-11-02 NOTE — ED Notes (Signed)
Wound cleansed, dressed with xeroform and antibiotic cream.

## 2016-11-02 NOTE — ED Notes (Signed)
Pt with "V" shaped avulsion laceration noted to top of left fifth finger where finger meets palm. Edges of laceration are approx 1.5 cm in length with controlled bleeding. No purulent drainage noted from laceration.

## 2016-11-02 NOTE — Discharge Instructions (Signed)
1. Keep wound clean and dry. 2. Return to the ER for worsening symptoms, increased redness/swelling, purulent discharge or other concerns.

## 2016-11-02 NOTE — ED Provider Notes (Signed)
[facility] Emergency Department Provider Note   ____________________________________________   First MD Initiated Contact with Patient 11/02/16 [phone]     (approximate)  I have reviewed the triage vital signs and the nursing notes.   HISTORY  Chief Complaint Hand laceration   HPI Jacob Rose is a 56 y.o. male who presents to the ED from home with a chief complaint of left hand laceration. Patient was changing the oil filter on his car yesterday at approximately 3 PM, tried to open the cap and cut himself in the hand.Patient is right handed; tetanus is up-to-date. Presents now for evaluation of cut because wife urged him to come. Voices no further medical complaints.   Past Medical History:  Diagnosis Date  . Coronary artery disease   . Hypercholesteremia   . Hypertension   . Shortness of breath dyspnea     Patient Active Problem List   Diagnosis Date Noted  . Diastolic dysfunction, left ventricle 03/17/2015  . Coronary artery abnormality 03/15/2015    Past Surgical History:  Procedure Laterality Date  . CARDIAC CATHETERIZATION N/A 03/19/2015   Procedure: Right/Left Heart Cath and Coronary Angiography;  Surgeon: Teodoro Spray, MD;  Location: [facility];  Service: Cardiovascular;  Laterality: N/A;  . HAND SURGERY Left   . TONSILLECTOMY    . UVULOPALATOPHARYNGOPLASTY      Prior to Admission medications   Medication Sig Start Date End Date Taking? Authorizing Provider  allopurinol (ZYLOPRIM) 300 MG tablet Take 300 mg by mouth daily.   Yes [provider]  isosorbide mononitrate (IMDUR) 30 MG 24 hr tablet Take 1 tablet (30 mg total) by mouth daily. 03/17/15 11/02/16 Yes Loney Hering, MD  simvastatin (ZOCOR) 40 MG tablet Take 40 mg by mouth daily.   Yes [provider]    Allergies Flexeril [cyclobenzaprine]  No family history on file.  Social History Social History  Substance Use Topics  . Smoking  status: Former Research scientist (life sciences)  . Smokeless tobacco: Current User    Types: Chew  . Alcohol use 3.0 oz/week    5 Cans of beer per week     Comment: 6 pack last night    Review of Systems  Constitutional: No fever/chills. Eyes: No visual changes. ENT: No sore throat. Cardiovascular: Denies chest pain. Respiratory: Denies shortness of breath. Gastrointestinal: No abdominal pain.  No nausea, no vomiting.  No diarrhea.  No constipation. Genitourinary: Negative for dysuria. Musculoskeletal: positive for left hand laceration. Negative for back pain. Skin: Negative for rash. Neurological: Negative for headaches, focal weakness or numbness.   ____________________________________________   PHYSICAL EXAM:  VITAL SIGNS: ED Triage Vitals  Enc Vitals Group     BP 11/02/16 0345 (!) 158/86     Pulse Rate 11/02/16 0345 89     Resp 11/02/16 0345 15     Temp 11/02/16 0345 98 F (36.7 C)     Temp Source 11/02/16 0345 Oral     SpO2 11/02/16 0345 99 %     Weight 11/02/16 0345 217 lb (98.4 kg)     Height 11/02/16 0345 5\' 6"  (1.676 m)     Head Circumference --      Peak Flow --      Pain Score 11/02/16 0344 9     Pain Loc --      Pain Edu? --      Excl. in [place]? --     Constitutional: Alert and oriented. Well appearing and in no  acute distress. Eyes: Conjunctivae are normal. PERRL. EOMI. Head: Atraumatic. Nose: No congestion/rhinnorhea. Mouth/Throat: Mucous membranes are moist.  Oropharynx non-erythematous. Neck: No stridor.   Cardiovascular: Normal rate, regular rhythm. Grossly normal heart sounds.  Good peripheral circulation. Respiratory: Normal respiratory effort.  No retractions. Lungs CTAB. Gastrointestinal: Soft and nontender. No distention. No abdominal bruits. No CVA tenderness. Musculoskeletal:  Left hand: Small avulsion-type injury to palmar aspect at fifth digit near metacarpal phalangeal joint. Small flap of thin, overlying skin. No active bleeding. No warmth or redness. Full  range of motion without pain. 2+ radial pulse. Brisk, less than 5 second capillary refill. Neurologic:  Normal speech and language. No gross focal neurologic deficits are appreciated. No gait instability. Skin:  Skin is warm, dry and intact. No rash noted. Psychiatric: Mood and affect are normal. Speech and behavior are normal.  ____________________________________________   LABS (all labs ordered are listed, but only abnormal results are displayed)  Labs Reviewed - No data to display ____________________________________________  EKG  none ____________________________________________  RADIOLOGY  No results found.  ____________________________________________   PROCEDURES  Procedure(s) performed: None  Procedures  Critical Care performed: No  ____________________________________________   INITIAL IMPRESSION / ASSESSMENT AND PLAN / ED COURSE  Pertinent labs & imaging results that were available during my care of the patient were reviewed by me and considered in my medical decision making (see chart for details).  56 year old male who presents with avulsion type wound to palmar aspect of left hand. Wound is greater than 15 hours old. Would not require sutures regardless. Will cleanse thoroughly, apply bacitracin, Xeroform gauze with dressing, and patient will follow-up with his PCP as needed. Strict return precautions given. Patient verbalizes understanding and agrees with plan of care.      ____________________________________________   FINAL CLINICAL IMPRESSION(S) / ED DIAGNOSES  Final diagnoses:  Laceration of left hand without foreign body, initial encounter      NEW MEDICATIONS STARTED DURING THIS VISIT:  New Prescriptions   No medications on file     Note:  This document was prepared using Dragon voice recognition software and may include unintentional dictation errors.    Paulette Blanch, MD 11/02/16 [phone]

## 2016-11-11 DIAGNOSIS — E781 Pure hyperglyceridemia: Secondary | ICD-10-CM | POA: Diagnosis not present

## 2016-11-11 DIAGNOSIS — Z125 Encounter for screening for malignant neoplasm of prostate: Secondary | ICD-10-CM | POA: Diagnosis not present

## 2016-11-11 DIAGNOSIS — M1000 Idiopathic gout, unspecified site: Secondary | ICD-10-CM | POA: Diagnosis not present

## 2016-11-11 DIAGNOSIS — G4733 Obstructive sleep apnea (adult) (pediatric): Secondary | ICD-10-CM | POA: Diagnosis not present

## 2016-11-11 DIAGNOSIS — R739 Hyperglycemia, unspecified: Secondary | ICD-10-CM | POA: Diagnosis not present

## 2016-11-17 DIAGNOSIS — Z0000 Encounter for general adult medical examination without abnormal findings: Secondary | ICD-10-CM | POA: Diagnosis not present

## 2016-11-17 DIAGNOSIS — I519 Heart disease, unspecified: Secondary | ICD-10-CM | POA: Diagnosis not present

## 2016-11-17 DIAGNOSIS — I10 Essential (primary) hypertension: Secondary | ICD-10-CM | POA: Diagnosis not present

## 2016-11-18 ENCOUNTER — Other Ambulatory Visit: Payer: Self-pay | Admitting: Internal Medicine

## 2016-11-18 DIAGNOSIS — R945 Abnormal results of liver function studies: Secondary | ICD-10-CM

## 2016-11-24 ENCOUNTER — Ambulatory Visit
Admission: RE | Admit: 2016-11-24 | Discharge: 2016-11-24 | Disposition: A | Discharge status: Home / Self Care | Payer: 59 | Source: Ambulatory Visit | Attending: Internal Medicine | Admitting: Internal Medicine

## 2016-11-24 DIAGNOSIS — R945 Abnormal results of liver function studies: Secondary | ICD-10-CM | POA: Diagnosis not present

## 2016-11-24 DIAGNOSIS — Z9049 Acquired absence of other specified parts of digestive tract: Secondary | ICD-10-CM | POA: Insufficient documentation

## 2016-11-24 DIAGNOSIS — R7989 Other specified abnormal findings of blood chemistry: Secondary | ICD-10-CM | POA: Diagnosis not present

## 2017-03-16 DIAGNOSIS — G4733 Obstructive sleep apnea (adult) (pediatric): Secondary | ICD-10-CM | POA: Diagnosis not present

## 2017-03-16 DIAGNOSIS — I10 Essential (primary) hypertension: Secondary | ICD-10-CM | POA: Diagnosis not present

## 2017-03-16 DIAGNOSIS — R21 Rash and other nonspecific skin eruption: Secondary | ICD-10-CM | POA: Diagnosis not present

## 2017-04-20 DIAGNOSIS — L239 Allergic contact dermatitis, unspecified cause: Secondary | ICD-10-CM | POA: Diagnosis not present

## 2017-05-11 DIAGNOSIS — R739 Hyperglycemia, unspecified: Secondary | ICD-10-CM | POA: Diagnosis not present

## 2017-05-11 DIAGNOSIS — E781 Pure hyperglyceridemia: Secondary | ICD-10-CM | POA: Diagnosis not present

## 2017-05-11 DIAGNOSIS — M1000 Idiopathic gout, unspecified site: Secondary | ICD-10-CM | POA: Diagnosis not present

## 2017-05-11 DIAGNOSIS — E349 Endocrine disorder, unspecified: Secondary | ICD-10-CM | POA: Diagnosis not present

## 2017-05-11 DIAGNOSIS — I10 Essential (primary) hypertension: Secondary | ICD-10-CM | POA: Diagnosis not present

## 2017-05-18 DIAGNOSIS — I10 Essential (primary) hypertension: Secondary | ICD-10-CM | POA: Diagnosis not present

## 2017-05-18 DIAGNOSIS — I519 Heart disease, unspecified: Secondary | ICD-10-CM | POA: Diagnosis not present

## 2017-05-18 DIAGNOSIS — G4733 Obstructive sleep apnea (adult) (pediatric): Secondary | ICD-10-CM | POA: Diagnosis not present

## 2017-10-13 DIAGNOSIS — Z86010 Personal history of colonic polyps: Secondary | ICD-10-CM | POA: Diagnosis not present

## 2017-10-13 DIAGNOSIS — R945 Abnormal results of liver function studies: Secondary | ICD-10-CM | POA: Diagnosis not present

## 2017-11-03 ENCOUNTER — Other Ambulatory Visit: Payer: Self-pay | Admitting: Orthopedic Surgery

## 2017-11-03 DIAGNOSIS — M19112 Post-traumatic osteoarthritis, left shoulder: Secondary | ICD-10-CM

## 2017-11-12 DIAGNOSIS — E349 Endocrine disorder, unspecified: Secondary | ICD-10-CM | POA: Diagnosis not present

## 2017-11-12 DIAGNOSIS — E781 Pure hyperglyceridemia: Secondary | ICD-10-CM | POA: Diagnosis not present

## 2017-11-12 DIAGNOSIS — R739 Hyperglycemia, unspecified: Secondary | ICD-10-CM | POA: Diagnosis not present

## 2017-11-12 DIAGNOSIS — M1000 Idiopathic gout, unspecified site: Secondary | ICD-10-CM | POA: Diagnosis not present

## 2017-11-12 DIAGNOSIS — I10 Essential (primary) hypertension: Secondary | ICD-10-CM | POA: Diagnosis not present

## 2017-11-12 DIAGNOSIS — G4733 Obstructive sleep apnea (adult) (pediatric): Secondary | ICD-10-CM | POA: Diagnosis not present

## 2017-11-12 DIAGNOSIS — Z125 Encounter for screening for malignant neoplasm of prostate: Secondary | ICD-10-CM | POA: Diagnosis not present

## 2017-11-17 ENCOUNTER — Ambulatory Visit: Admission: RE | Admit: 2017-11-17 | Payer: Worker's Compensation | Source: Ambulatory Visit

## 2017-11-19 DIAGNOSIS — I10 Essential (primary) hypertension: Secondary | ICD-10-CM | POA: Diagnosis not present

## 2017-11-19 DIAGNOSIS — M1000 Idiopathic gout, unspecified site: Secondary | ICD-10-CM | POA: Diagnosis not present

## 2017-11-19 DIAGNOSIS — G4733 Obstructive sleep apnea (adult) (pediatric): Secondary | ICD-10-CM | POA: Diagnosis not present

## 2017-11-26 ENCOUNTER — Encounter: Payer: Self-pay | Admitting: Anesthesiology

## 2017-11-26 ENCOUNTER — Ambulatory Visit: Payer: 59 | Admitting: Anesthesiology

## 2017-11-26 ENCOUNTER — Ambulatory Visit
Admission: RE | Admit: 2017-11-26 | Discharge: 2017-11-26 | Disposition: A | Discharge status: Home / Self Care | Payer: 59 | Source: Ambulatory Visit | Attending: Gastroenterology | Admitting: Gastroenterology

## 2017-11-26 ENCOUNTER — Encounter
Admission: RE | Disposition: A | Discharge status: Home / Self Care | Payer: Self-pay | Source: Ambulatory Visit | Attending: Gastroenterology

## 2017-11-26 ENCOUNTER — Other Ambulatory Visit: Payer: Self-pay

## 2017-11-26 DIAGNOSIS — Z86010 Personal history of colonic polyps: Secondary | ICD-10-CM | POA: Insufficient documentation

## 2017-11-26 DIAGNOSIS — Z6835 Body mass index (BMI) 35.0-35.9, adult: Secondary | ICD-10-CM | POA: Diagnosis not present

## 2017-11-26 DIAGNOSIS — E7800 Pure hypercholesterolemia, unspecified: Secondary | ICD-10-CM | POA: Diagnosis not present

## 2017-11-26 DIAGNOSIS — I2510 Atherosclerotic heart disease of native coronary artery without angina pectoris: Secondary | ICD-10-CM | POA: Diagnosis not present

## 2017-11-26 DIAGNOSIS — E669 Obesity, unspecified: Secondary | ICD-10-CM | POA: Diagnosis not present

## 2017-11-26 DIAGNOSIS — Z888 Allergy status to other drugs, medicaments and biological substances status: Secondary | ICD-10-CM | POA: Diagnosis not present

## 2017-11-26 DIAGNOSIS — I10 Essential (primary) hypertension: Secondary | ICD-10-CM | POA: Diagnosis not present

## 2017-11-26 DIAGNOSIS — Z1211 Encounter for screening for malignant neoplasm of colon: Secondary | ICD-10-CM | POA: Insufficient documentation

## 2017-11-26 DIAGNOSIS — Z79899 Other long term (current) drug therapy: Secondary | ICD-10-CM | POA: Diagnosis not present

## 2017-11-26 DIAGNOSIS — D123 Benign neoplasm of transverse colon: Secondary | ICD-10-CM | POA: Diagnosis not present

## 2017-11-26 DIAGNOSIS — K635 Polyp of colon: Secondary | ICD-10-CM | POA: Diagnosis not present

## 2017-11-26 DIAGNOSIS — K648 Other hemorrhoids: Secondary | ICD-10-CM | POA: Diagnosis not present

## 2017-11-26 DIAGNOSIS — K640 First degree hemorrhoids: Secondary | ICD-10-CM | POA: Diagnosis not present

## 2017-11-26 DIAGNOSIS — Z87891 Personal history of nicotine dependence: Secondary | ICD-10-CM | POA: Diagnosis not present

## 2017-11-26 DIAGNOSIS — D124 Benign neoplasm of descending colon: Secondary | ICD-10-CM | POA: Insufficient documentation

## 2017-11-26 DIAGNOSIS — M109 Gout, unspecified: Secondary | ICD-10-CM | POA: Diagnosis not present

## 2017-11-26 DIAGNOSIS — K5730 Diverticulosis of large intestine without perforation or abscess without bleeding: Secondary | ICD-10-CM | POA: Diagnosis not present

## 2017-11-26 HISTORY — PX: COLONOSCOPY WITH PROPOFOL: SHX5780

## 2017-11-26 SURGERY — COLONOSCOPY WITH PROPOFOL
Anesthesia: General

## 2017-11-26 MED ORDER — SODIUM CHLORIDE 0.9 % IV SOLN
INTRAVENOUS | Status: DC
  Administered 2017-11-26 (×2): via INTRAVENOUS

## 2017-11-26 MED ORDER — PROPOFOL 500 MG/50ML IV EMUL
INTRAVENOUS | Status: AC
Start: 2017-11-26 — End: ?
  Filled 2017-11-26: qty 50

## 2017-11-26 MED ORDER — LIDOCAINE HCL (PF) 2 % IJ SOLN
INTRAMUSCULAR | Status: AC
  Filled 2017-11-26: qty 10

## 2017-11-26 MED ORDER — PROPOFOL 10 MG/ML IV BOLUS
INTRAVENOUS | Status: DC | PRN
  Administered 2017-11-26: 125 ug/kg/min via INTRAVENOUS

## 2017-11-26 MED ORDER — LIDOCAINE HCL (CARDIAC) PF 100 MG/5ML IV SOSY
PREFILLED_SYRINGE | INTRAVENOUS | Status: DC | PRN
  Administered 2017-11-26: 25 mg via INTRATRACHEAL

## 2017-11-26 MED ORDER — PROPOFOL 10 MG/ML IV BOLUS
INTRAVENOUS | Status: DC | PRN
  Administered 2017-11-26 (×5): 100 mg via INTRAVENOUS

## 2017-11-26 NOTE — H&P (Signed)
Outpatient short stay form Pre-procedure 11/26/2017 1:28 PM Lollie Sails MD  Primary Physician: Ramonita Lab, MD  Reason for visit: Colonoscopy  History of present illness: Patient is a 57 year old male with a known history of colon polyps.  Last colonoscopy was 05/20/2012 with finding of several adenomatous.  Tolerated his prep well.  He takes no aspirin or blood thinning agent.    Current Facility-Administered Medications:  .  0.9 %  sodium chloride infusion, , Intravenous, Continuous, Lollie Sails, MD, Last Rate: 20 mL/hr at 11/26/17 1320  Medications Prior to Admission  Medication Sig Dispense Refill Last Dose  . allopurinol (ZYLOPRIM) 300 MG tablet Take 300 mg by mouth daily.   11/25/2017 at Unknown time  . simvastatin (ZOCOR) 40 MG tablet Take 40 mg by mouth daily.   11/25/2017 at Unknown time  . isosorbide mononitrate (IMDUR) 30 MG 24 hr tablet Take 1 tablet (30 mg total) by mouth daily. 15 tablet 0 03/18/2015     Allergies  Allergen Reactions  . Flexeril [Cyclobenzaprine]     Bad dreams     Past Medical History:  Diagnosis Date  . Coronary artery disease   . Hypercholesteremia   . Hypertension   . Shortness of breath dyspnea     Review of systems:      Physical Exam    Heart and lungs: Regular rate and rhythm without rub or gallop, lungs are bilaterally clear.    HEENT: Normocephalic atraumatic eyes are anicteric    Other:    Pertinant exam for procedure: Soft nontender nondistended bowel sounds positive normoactive.  The inferior liver margin is palpable particularly the left lobe which is more stiff than the right lobe.    Planned proceedures: Colonoscopy and indicated procedures. I have discussed the risks benefits and complications of procedures to include not limited to bleeding, infection, perforation and the risk of sedation and the patient wishes to proceed.  Patient has a known history of elevated liver enzymes in the past.  Discussed with  him today the finding on physical exam and have recommended repeat ultrasound.  He did have one a little over a year ago.  We will then have him follow-up in the office again call.    Lollie Sails, MD Gastroenterology 11/26/2017  1:28 PM

## 2017-11-26 NOTE — Anesthesia Postprocedure Evaluation (Signed)
Anesthesia Post Note  Patient: Jacob Rose  Procedure(s) Performed: COLONOSCOPY WITH PROPOFOL (N/A )  Anesthesia Type: General     Last Vitals:  Vitals:   11/26/17 1457 11/26/17 1507  BP:  (!) 146/89  Pulse: 77 65  Resp: 17 12  Temp:    SpO2: 100% 100%    Last Pain:  Vitals:   11/26/17 1457  TempSrc:   PainSc: (P) 0-No pain                 Ayo Smoak S

## 2017-11-26 NOTE — Transfer of Care (Signed)
Immediate Anesthesia Transfer of Care Note  Patient: Jacob Rose  Procedure(s) Performed: COLONOSCOPY WITH PROPOFOL (N/A )  Patient Location: PACU and Endoscopy Unit  Anesthesia Type:General  Level of Consciousness: awake and alert   Airway & Oxygen Therapy: Patient Spontanous Breathing  Post-op Assessment: Report given to RN and Post -op Vital signs reviewed and stable  Post vital signs: Reviewed and stable  Last Vitals:  Vitals Value Taken Time  BP 122/91 11/26/2017  2:38 PM  Temp 36.2 C 11/26/2017  2:39 PM  Pulse 84 11/26/2017  2:41 PM  Resp 24 11/26/2017  2:41 PM  SpO2 98 % 11/26/2017  2:41 PM  Vitals shown include unvalidated device data.  Last Pain:  Vitals:   11/26/17 1438  TempSrc:   PainSc: 0-No pain         Complications: No apparent anesthesia complications
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
## 2017-11-26 NOTE — Op Note (Signed)
[facility] Gastroenterology Patient Name: Jacob Rose Procedure Date: 11/26/2017 1:30 PM MRN: [id number] Account #: [id number] Date of Birth: 12-17-1960 Admit Type: Outpatient Age: 57 Room: [facility] ENDO ROOM 3 Gender: Male Note Status: Finalized Procedure:            Colonoscopy Indications:          Personal history of colonic polyps Providers:            Lollie Sails, MD Referring MD:         Ramonita Lab, MD (Referring MD) Medicines:            Monitored Anesthesia Care Complications:        No immediate complications. Procedure:            Pre-Anesthesia Assessment:                       - ASA Grade Assessment: III - A patient with severe                        systemic disease.                       After obtaining informed consent, the colonoscope was                        passed under direct vision. Throughout the procedure,                        the patient's blood pressure, pulse, and oxygen                        saturations were monitored continuously. The                        Colonoscope was introduced through the anus and                        advanced to the the cecum, identified by appendiceal                        orifice and ileocecal valve. The colonoscopy was                        unusually difficult due to significant looping, a                        tortuous colon and the patient's body habitus.                        Successful completion of the procedure was aided by                        changing the patient to a supine position, changing the                        patient to a prone position and applying abdominal                        pressure. The patient tolerated the procedure well. The  quality of the bowel preparation was fair. Findings:      Multiple small-mouthed diverticula were found in the sigmoid colon and       descending colon.      Non-bleeding internal hemorrhoids were found during  retroflexion and       during anoscopy. The hemorrhoids were small and Grade I (internal       hemorrhoids that do not prolapse).      Two sessile polyps were found in the distal descending colon. The polyps       were 3 to 4 mm in size. These polyps were removed with a cold snare.       Resection and retrieval were complete.      A 5 mm polyp was found in the transverse colon. The polyp was sessile.       The polyp was removed with a cold snare. Resection and retrieval were       complete.      A 14 mm polyp was found in the hepatic flexure. The polyp was sessile.       Polypectomy was attempted, initially using a piecemeal technique with a       hot snare. Polyp resection was incomplete with this device. This       intervention then required a different device and polypectomy technique.       The polyp was removed with a cold biopsy forceps. Resection and       retrieval were complete.      A 2 mm polyp was found in the hepatic flexure. The polyp was sessile.       The polyp was removed with a cold biopsy forceps. Resection and       retrieval were complete, placed into the same container as other hepatic       flexure.      The digital rectal exam was normal. Impression:           - Preparation of the colon was fair.                       - Diverticulosis in the sigmoid colon and in the                        descending colon.                       - Non-bleeding internal hemorrhoids.                       - Two 3 to 4 mm polyps in the distal descending colon,                        removed with a cold snare. Resected and retrieved.                       - One 5 mm polyp in the transverse colon, removed with                        a cold snare. Resected and retrieved.                       - One 14 mm polyp at the hepatic flexure, removed with  a cold biopsy forceps. Resected and retrieved.                       - One 2 mm polyp at the hepatic flexure, removed  with a                        cold biopsy forceps. Resected and retrieved. Recommendation:       - Discharge patient to home.                       - Await pathology results.                       - Full liquid diet today.                       - Soft diet for 2 days, then advance as tolerated to                        advance diet as tolerated.                       - Telephone GI clinic for pathology results in 1 week. Procedure Code(s):    --- Professional ---                       [phone], Colonoscopy, flexible; with removal of tumor(s),                        polyp(s), or other lesion(s) by snare technique                       45380, 62, Colonoscopy, flexible; with biopsy, single                        or multiple Diagnosis Code(s):    --- Professional ---                       K64.0, First degree hemorrhoids                       D12.4, Benign neoplasm of descending colon                       D12.3, Benign neoplasm of transverse colon (hepatic                        flexure or splenic flexure)                       Z86.010, Personal history of colonic polyps                       K57.30, Diverticulosis of large intestine without                        perforation or abscess without bleeding CPT copyright 2018 American Medical Association. All rights reserved. The codes documented in this report are preliminary and upon coder review may  be revised to meet current compliance requirements. Lollie Sails, MD 11/26/2017 2:37:32 PM This report has been signed electronically. Number of Addenda: 0 Note Initiated  On: 11/26/2017 1:30 PM Scope Withdrawal Time: 0 hours 22 minutes 37 seconds  Total Procedure Duration: 0 hours 42 minutes 52 seconds       [facility]

## 2017-11-26 NOTE — Anesthesia Post-op Follow-up Note (Signed)
Anesthesia QCDR form completed.        

## 2017-11-29 ENCOUNTER — Encounter: Payer: Self-pay | Admitting: Gastroenterology

## 2017-11-30 LAB — SURGICAL PATHOLOGY

## 2017-12-10 DIAGNOSIS — IMO0001 Reserved for inherently not codable concepts without codable children: Secondary | ICD-10-CM

## 2017-12-10 HISTORY — DX: Reserved for inherently not codable concepts without codable children: IMO0001

## 2017-12-14 ENCOUNTER — Ambulatory Visit: Payer: Self-pay | Admitting: Orthopedic Surgery

## 2017-12-14 ENCOUNTER — Other Ambulatory Visit: Payer: Self-pay

## 2017-12-14 ENCOUNTER — Encounter
Admission: RE | Admit: 2017-12-14 | Discharge: 2017-12-14 | Disposition: A | Discharge status: Home / Self Care | Payer: Worker's Compensation | Source: Ambulatory Visit | Attending: Orthopedic Surgery | Admitting: Orthopedic Surgery

## 2017-12-14 DIAGNOSIS — I498 Other specified cardiac arrhythmias: Secondary | ICD-10-CM | POA: Insufficient documentation

## 2017-12-14 DIAGNOSIS — Z01818 Encounter for other preprocedural examination: Secondary | ICD-10-CM | POA: Insufficient documentation

## 2017-12-14 DIAGNOSIS — M19112 Post-traumatic osteoarthritis, left shoulder: Secondary | ICD-10-CM | POA: Insufficient documentation

## 2017-12-14 DIAGNOSIS — I10 Essential (primary) hypertension: Secondary | ICD-10-CM | POA: Insufficient documentation

## 2017-12-14 HISTORY — DX: Unspecified osteoarthritis, unspecified site: M19.90

## 2017-12-14 HISTORY — DX: Gout, unspecified: M10.9

## 2017-12-14 HISTORY — DX: Other specified abnormal findings of blood chemistry: R79.89

## 2017-12-14 HISTORY — DX: Sleep apnea, unspecified: G47.30

## 2017-12-14 LAB — BASIC METABOLIC PANEL
ANION GAP: 10 (ref 5–15)
BUN: 11 mg/dL (ref 6–20)
CALCIUM: 9.7 mg/dL (ref 8.9–10.3)
CO2: 24 mmol/L (ref 22–32)
Chloride: 102 mmol/L (ref 98–111)
Creatinine, Ser: 0.76 mg/dL (ref 0.61–1.24)
GFR calc Af Amer: >60 mL/min (ref >60)
GLUCOSE: 145 mg/dL — AB (ref 70–99)
Potassium: 4 mmol/L (ref 3.5–5.1)
SODIUM: 136 mmol/L (ref 135–145)

## 2017-12-14 LAB — URINALYSIS, ROUTINE W REFLEX MICROSCOPIC
BILIRUBIN URINE: NEGATIVE
Glucose, UA: NEGATIVE mg/dL
Hgb urine dipstick: NEGATIVE
KETONES UR: NEGATIVE mg/dL
Leukocytes, UA: NEGATIVE
Nitrite: NEGATIVE
PH: 6 (ref 5.0–8.0)
PROTEIN: NEGATIVE mg/dL
Specific Gravity, Urine: 1.023 (ref 1.005–1.030)

## 2017-12-14 LAB — SURGICAL PCR SCREEN
MRSA, PCR: NEGATIVE
Staphylococcus aureus: NEGATIVE

## 2017-12-14 LAB — CBC
HCT: 42 % (ref 39.0–52.0)
Hemoglobin: 15 g/dL (ref 13.0–17.0)
MCH: 32.5 pg (ref 26.0–34.0)
MCHC: 35.7 g/dL (ref 30.0–36.0)
MCV: 91.1 fL (ref 80.0–100.0)
PLATELETS: 230 10*3/uL (ref 150–400)
RBC: 4.61 MIL/uL (ref 4.22–5.81)
RDW: 12 % (ref 11.5–15.5)
WBC: 15 10*3/uL — ABNORMAL HIGH (ref 4.0–10.5)
nRBC: 0 % (ref 0.0–0.2)

## 2017-12-14 LAB — APTT: APTT: 29 s (ref 24–36)

## 2017-12-14 LAB — PROTIME-INR
INR: 0.93
PROTHROMBIN TIME: 12.4 s (ref 11.4–15.2)

## 2017-12-14 NOTE — Patient Instructions (Signed)
Your procedure is scheduled on: Wednesday,December 29, 2017  Report to THE SECOND FLOOR OF THE MEDICAL MALL      DO NOT STOP ON THE FIRST FLOOR TO REGISTER \ To find out your arrival time please call [phone] between 1PM - 3PM on Tuesday, December 28, 2017  Remember: Instructions that are not followed completely may result in serious medical risk,  up to and including death, or upon the discretion of your surgeon and anesthesiologist your  surgery may need to be rescheduled.     _X__ 1. Do not eat food after midnight the night before your procedure.                 No gum chewing or hard candies.                     ABSOLUTELY NOTHING SOLID IN YOUR MOUTH AFTER MIDNIGHT                  You may drink clear liquids up to 2 hours before you are scheduled to arrive for your surgery-                   DO not drink clear liquids within 2 hours of the start of your surgery.                  Clear Liquids include:  water, apple juice without pulp, clear carbohydrate                 drink such as Clearfast of Gatorade, Black Coffee or Tea (Do not add                 anything to coffee or tea).  __X__2.  On the morning of surgery brush your teeth with toothpaste and water,                    You may rinse your mouth with mouthwash if you wish.                       Do not swallow any toothpaste of mouthwash.     _X__ 3.  No Alcohol for 24 hours before or after surgery.   _X__ 4.  Do Not Smoke or use e-cigarettes For 24 Hours Prior to Your Surgery.                 Do not use any chewable tobacco products for at least 6 hours prior to                 surgery.  ____  5.  Bring all medications with you on the day of surgery if instructed.   __X__  6.  Notify your doctor if there is any change in your medical condition      (cold, fever, infections).     Do not wear jewelry, make-up, hairpins, clips or nail polish. Do not wear lotions, powders, or perfumes. You  may NOT wear deodorant.(at least under left arm) Do not shave 48 hours prior to surgery. Men may shave face and neck. Do not bring valuables to the hospital.    [facility] is not responsible for any belongings or valuables.  Contacts, dentures or bridgework may not be worn into surgery. Leave your suitcase in the car. After surgery it may be brought to your room. For patients admitted to the hospital, discharge time is determined by  your treatment team.   Patients discharged the day of surgery will not be allowed to drive home.   Please read over the following fact sheets that you were given:   PREPARING FOR SURGERY                  MRSA: STOP THE SPREAD  ____ Take these medicines the morning of surgery with A SIP OF WATER:    1. NONE  2.   3.   4.  5.  6.  ____ Fleet Enema (as directed)   _X___ Use CHG Soap as directed  ____ Use inhalers on the day of surgery  __X__ Stop ALL ASPIRIN PRODUCTS TODAY  __X__ Stop Anti-inflammatories AS OF TODAY              THIS INCLUDES IBUPROFEN / MOTRIN / ADVIL / ALEVE                 YOU MAY TAKE TYLENOL AT ANY TIME   ____ Stop supplements until after surgery.    ____ Bring C-Pap to the hospital.   WEAR COMFORTABLE CLOTHES FOR AFTER SURGERY  KEEP TAKING YOUR REGULAR PRESCRIPTION MEDICINE AT NIGHT AS USUAL.

## 2017-12-21 ENCOUNTER — Ambulatory Visit
Admission: RE | Admit: 2017-12-21 | Discharge: 2017-12-21 | Disposition: A | Discharge status: Home / Self Care | Payer: Worker's Compensation | Source: Ambulatory Visit | Attending: Orthopedic Surgery | Admitting: Orthopedic Surgery

## 2017-12-21 DIAGNOSIS — M19112 Post-traumatic osteoarthritis, left shoulder: Secondary | ICD-10-CM | POA: Diagnosis not present

## 2017-12-28 DIAGNOSIS — H903 Sensorineural hearing loss, bilateral: Secondary | ICD-10-CM | POA: Diagnosis not present

## 2017-12-28 MED ORDER — CEFAZOLIN SODIUM-DEXTROSE 2-4 GM/100ML-% IV SOLN
2.0000 g | INTRAVENOUS | Status: AC
  Administered 2017-12-29: 2 g via INTRAVENOUS

## 2017-12-29 ENCOUNTER — Other Ambulatory Visit: Payer: Self-pay

## 2017-12-29 ENCOUNTER — Encounter: Payer: Self-pay | Admitting: Anesthesiology

## 2017-12-29 ENCOUNTER — Inpatient Hospital Stay: Payer: Worker's Compensation | Admitting: Anesthesiology

## 2017-12-29 ENCOUNTER — Inpatient Hospital Stay
Admission: RE | Admit: 2017-12-29 | Discharge: 2017-12-30 | DRG: 483 | Disposition: A | Discharge status: Home / Self Care | Payer: Worker's Compensation | Attending: Orthopedic Surgery | Admitting: Orthopedic Surgery

## 2017-12-29 ENCOUNTER — Inpatient Hospital Stay: Payer: Worker's Compensation

## 2017-12-29 ENCOUNTER — Encounter
Admission: RE | Disposition: A | Discharge status: Home / Self Care | Payer: Self-pay | Source: Home / Self Care | Attending: Orthopedic Surgery

## 2017-12-29 DIAGNOSIS — I10 Essential (primary) hypertension: Secondary | ICD-10-CM | POA: Diagnosis present

## 2017-12-29 DIAGNOSIS — Z888 Allergy status to other drugs, medicaments and biological substances status: Secondary | ICD-10-CM | POA: Diagnosis not present

## 2017-12-29 DIAGNOSIS — I2510 Atherosclerotic heart disease of native coronary artery without angina pectoris: Secondary | ICD-10-CM | POA: Diagnosis present

## 2017-12-29 DIAGNOSIS — Z09 Encounter for follow-up examination after completed treatment for conditions other than malignant neoplasm: Secondary | ICD-10-CM

## 2017-12-29 DIAGNOSIS — G4730 Sleep apnea, unspecified: Secondary | ICD-10-CM | POA: Diagnosis present

## 2017-12-29 DIAGNOSIS — Z79899 Other long term (current) drug therapy: Secondary | ICD-10-CM | POA: Diagnosis not present

## 2017-12-29 DIAGNOSIS — M19112 Post-traumatic osteoarthritis, left shoulder: Secondary | ICD-10-CM | POA: Diagnosis not present

## 2017-12-29 DIAGNOSIS — E7800 Pure hypercholesterolemia, unspecified: Secondary | ICD-10-CM | POA: Diagnosis present

## 2017-12-29 DIAGNOSIS — M19012 Primary osteoarthritis, left shoulder: Secondary | ICD-10-CM | POA: Diagnosis present

## 2017-12-29 DIAGNOSIS — M109 Gout, unspecified: Secondary | ICD-10-CM | POA: Diagnosis present

## 2017-12-29 DIAGNOSIS — Z87891 Personal history of nicotine dependence: Secondary | ICD-10-CM

## 2017-12-29 DIAGNOSIS — M25512 Pain in left shoulder: Secondary | ICD-10-CM | POA: Diagnosis present

## 2017-12-29 DIAGNOSIS — G8918 Other acute postprocedural pain: Secondary | ICD-10-CM | POA: Diagnosis not present

## 2017-12-29 HISTORY — PX: TOTAL SHOULDER ARTHROPLASTY: SHX126

## 2017-12-29 SURGERY — ARTHROPLASTY, SHOULDER, TOTAL
Anesthesia: General | Site: Shoulder | Laterality: Left

## 2017-12-29 MED ORDER — METOCLOPRAMIDE HCL 10 MG PO TABS: 5.0000 mg | ORAL_TABLET | Freq: Three times a day (TID) | ORAL | Status: DC | PRN

## 2017-12-29 MED ORDER — HYDROCODONE-ACETAMINOPHEN 7.5-325 MG PO TABS
1.0000 | ORAL_TABLET | ORAL | Status: DC | PRN
  Administered 2017-12-29 – 2017-12-30 (×2): 2 via ORAL
  Filled 2017-12-29 (×2): qty 2

## 2017-12-29 MED ORDER — ALLOPURINOL 300 MG PO TABS
300.0000 mg | ORAL_TABLET | Freq: Every day | ORAL | Status: DC
  Administered 2017-12-29: 300 mg via ORAL
  Filled 2017-12-29 (×2): qty 1

## 2017-12-29 MED ORDER — LISINOPRIL 10 MG PO TABS
10.0000 mg | ORAL_TABLET | Freq: Every day | ORAL | Status: DC
  Administered 2017-12-29: 10 mg via ORAL
  Filled 2017-12-29: qty 1

## 2017-12-29 MED ORDER — MIDAZOLAM HCL 2 MG/2ML IJ SOLN
1.0000 mg | Freq: Once | INTRAMUSCULAR | Status: AC
  Administered 2017-12-29: 1 mg via INTRAVENOUS

## 2017-12-29 MED ORDER — FENTANYL CITRATE (PF) 100 MCG/2ML IJ SOLN
50.0000 ug | Freq: Once | INTRAMUSCULAR | Status: AC
  Administered 2017-12-29: 50 ug via INTRAVENOUS

## 2017-12-29 MED ORDER — ONDANSETRON HCL 4 MG/2ML IJ SOLN
INTRAMUSCULAR | Status: AC
  Filled 2017-12-29: qty 2

## 2017-12-29 MED ORDER — SUCCINYLCHOLINE CHLORIDE 20 MG/ML IJ SOLN
INTRAMUSCULAR | Status: DC | PRN
  Administered 2017-12-29: 120 mg via INTRAVENOUS

## 2017-12-29 MED ORDER — PROPOFOL 10 MG/ML IV BOLUS
INTRAVENOUS | Status: DC | PRN
  Administered 2017-12-29: 200 mg via INTRAVENOUS

## 2017-12-29 MED ORDER — CHLORHEXIDINE GLUCONATE 4 % EX LIQD: 60.0000 mL | Freq: Once | CUTANEOUS | Status: DC

## 2017-12-29 MED ORDER — FENTANYL CITRATE (PF) 100 MCG/2ML IJ SOLN
25.0000 ug | INTRAMUSCULAR | Status: DC | PRN
  Administered 2017-12-29 (×4): 50 ug via INTRAVENOUS

## 2017-12-29 MED ORDER — CEFAZOLIN SODIUM-DEXTROSE 2-4 GM/100ML-% IV SOLN
2.0000 g | Freq: Four times a day (QID) | INTRAVENOUS | Status: AC
  Administered 2017-12-29 – 2017-12-30 (×3): 2 g via INTRAVENOUS
  Filled 2017-12-29 (×3): qty 100

## 2017-12-29 MED ORDER — BUPIVACAINE-EPINEPHRINE (PF) 0.25% -1:200000 IJ SOLN
INTRAMUSCULAR | Status: AC
  Filled 2017-12-29: qty 30

## 2017-12-29 MED ORDER — BUPIVACAINE HCL (PF) 0.5 % IJ SOLN
INTRAMUSCULAR | Status: DC | PRN
  Administered 2017-12-29: 3 mL via PERINEURAL
  Administered 2017-12-29: 7 mL via PERINEURAL

## 2017-12-29 MED ORDER — GABAPENTIN 300 MG PO CAPS
ORAL_CAPSULE | ORAL | Status: AC
  Filled 2017-12-29: qty 1

## 2017-12-29 MED ORDER — ACETAMINOPHEN 325 MG PO TABS: 325.0000 mg | ORAL_TABLET | Freq: Four times a day (QID) | ORAL | Status: DC | PRN

## 2017-12-29 MED ORDER — GABAPENTIN 300 MG PO CAPS
300.0000 mg | ORAL_CAPSULE | Freq: Three times a day (TID) | ORAL | Status: DC
  Administered 2017-12-29 – 2017-12-30 (×2): 300 mg via ORAL
  Filled 2017-12-29 (×3): qty 1

## 2017-12-29 MED ORDER — LIDOCAINE HCL (PF) 1 % IJ SOLN
INTRAMUSCULAR | Status: AC
  Filled 2017-12-29: qty 5

## 2017-12-29 MED ORDER — SIMVASTATIN 20 MG PO TABS
40.0000 mg | ORAL_TABLET | Freq: Every day | ORAL | Status: DC
  Administered 2017-12-29: 40 mg via ORAL
  Filled 2017-12-29: qty 2

## 2017-12-29 MED ORDER — PROPOFOL 500 MG/50ML IV EMUL
INTRAVENOUS | Status: AC
  Filled 2017-12-29: qty 50

## 2017-12-29 MED ORDER — ONDANSETRON HCL 4 MG/2ML IJ SOLN
INTRAMUSCULAR | Status: DC | PRN
  Administered 2017-12-29 (×2): 4 mg via INTRAVENOUS

## 2017-12-29 MED ORDER — ONDANSETRON HCL 4 MG PO TABS: 4.0000 mg | ORAL_TABLET | Freq: Four times a day (QID) | ORAL | Status: DC | PRN

## 2017-12-29 MED ORDER — MAGNESIUM CITRATE PO SOLN
1.0000 | Freq: Once | ORAL | Status: DC | PRN
  Filled 2017-12-29: qty 296

## 2017-12-29 MED ORDER — HYDROMORPHONE HCL 1 MG/ML IJ SOLN
INTRAMUSCULAR | Status: AC
  Filled 2017-12-29: qty 1

## 2017-12-29 MED ORDER — IPRATROPIUM-ALBUTEROL 0.5-2.5 (3) MG/3ML IN SOLN
RESPIRATORY_TRACT | Status: AC
  Filled 2017-12-29: qty 3

## 2017-12-29 MED ORDER — MAGNESIUM HYDROXIDE 400 MG/5ML PO SUSP: 30.0000 mL | Freq: Every day | ORAL | Status: DC | PRN

## 2017-12-29 MED ORDER — ACETAMINOPHEN 500 MG PO TABS
500.0000 mg | ORAL_TABLET | Freq: Four times a day (QID) | ORAL | Status: AC
  Administered 2017-12-29 – 2017-12-30 (×4): 500 mg via ORAL
  Filled 2017-12-29 (×4): qty 1

## 2017-12-29 MED ORDER — OXYCODONE HCL 5 MG PO TABS: 5.0000 mg | ORAL_TABLET | Freq: Once | ORAL | Status: DC | PRN

## 2017-12-29 MED ORDER — MENTHOL 3 MG MT LOZG
1.0000 | LOZENGE | OROMUCOSAL | Status: DC | PRN
  Filled 2017-12-29: qty 9

## 2017-12-29 MED ORDER — HYDROCODONE-ACETAMINOPHEN 5-325 MG PO TABS: 1.0000 | ORAL_TABLET | ORAL | Status: DC | PRN

## 2017-12-29 MED ORDER — OXYCODONE HCL 5 MG/5ML PO SOLN: 5.0000 mg | Freq: Once | ORAL | Status: DC | PRN

## 2017-12-29 MED ORDER — GABAPENTIN 300 MG PO CAPS
300.0000 mg | ORAL_CAPSULE | Freq: Once | ORAL | Status: AC
  Administered 2017-12-29: 300 mg via ORAL

## 2017-12-29 MED ORDER — KETOROLAC TROMETHAMINE 15 MG/ML IJ SOLN
15.0000 mg | Freq: Four times a day (QID) | INTRAMUSCULAR | Status: AC
  Administered 2017-12-29 – 2017-12-30 (×4): 15 mg via INTRAVENOUS
  Filled 2017-12-29 (×4): qty 1

## 2017-12-29 MED ORDER — ROCURONIUM BROMIDE 100 MG/10ML IV SOLN
INTRAVENOUS | Status: DC | PRN
  Administered 2017-12-29: 40 mg via INTRAVENOUS
  Administered 2017-12-29: 30 mg via INTRAVENOUS
  Administered 2017-12-29: 10 mg via INTRAVENOUS
  Administered 2017-12-29: 20 mg via INTRAVENOUS

## 2017-12-29 MED ORDER — FAMOTIDINE 20 MG PO TABS
ORAL_TABLET | ORAL | Status: AC
  Filled 2017-12-29: qty 1

## 2017-12-29 MED ORDER — PROPOFOL 10 MG/ML IV BOLUS
INTRAVENOUS | Status: AC
  Filled 2017-12-29: qty 20

## 2017-12-29 MED ORDER — CEFAZOLIN SODIUM-DEXTROSE 2-4 GM/100ML-% IV SOLN
INTRAVENOUS | Status: AC
  Filled 2017-12-29: qty 100

## 2017-12-29 MED ORDER — PHENYLEPHRINE HCL 10 MG/ML IJ SOLN
INTRAMUSCULAR | Status: DC | PRN
  Administered 2017-12-29 (×2): 100 ug via INTRAVENOUS

## 2017-12-29 MED ORDER — SEVOFLURANE IN SOLN
RESPIRATORY_TRACT | Status: AC
  Filled 2017-12-29: qty 250

## 2017-12-29 MED ORDER — KETOROLAC TROMETHAMINE 15 MG/ML IJ SOLN
INTRAMUSCULAR | Status: AC
  Filled 2017-12-29: qty 1

## 2017-12-29 MED ORDER — MIDAZOLAM HCL 2 MG/2ML IJ SOLN
INTRAMUSCULAR | Status: DC | PRN
  Administered 2017-12-29: 2 mg via INTRAVENOUS

## 2017-12-29 MED ORDER — BUPIVACAINE-EPINEPHRINE (PF) 0.25% -1:200000 IJ SOLN
INTRAMUSCULAR | Status: DC | PRN
  Administered 2017-12-29: 17 mL via PERINEURAL

## 2017-12-29 MED ORDER — MIDAZOLAM HCL 2 MG/2ML IJ SOLN
INTRAMUSCULAR | Status: AC
  Filled 2017-12-29: qty 2

## 2017-12-29 MED ORDER — MIDAZOLAM HCL 2 MG/2ML IJ SOLN
INTRAMUSCULAR | Status: AC
  Administered 2017-12-29: 1 mg via INTRAVENOUS
  Filled 2017-12-29: qty 2

## 2017-12-29 MED ORDER — HYDROMORPHONE HCL 1 MG/ML IJ SOLN
0.5000 mg | INTRAMUSCULAR | Status: DC | PRN
  Administered 2017-12-29 (×3): 0.5 mg via INTRAVENOUS

## 2017-12-29 MED ORDER — SODIUM CHLORIDE 0.9 % IV SOLN
INTRAVENOUS | Status: DC | PRN
  Administered 2017-12-29: 25 ug/min via INTRAVENOUS

## 2017-12-29 MED ORDER — FENTANYL CITRATE (PF) 100 MCG/2ML IJ SOLN
INTRAMUSCULAR | Status: AC
  Administered 2017-12-29: 50 ug via INTRAVENOUS
  Filled 2017-12-29: qty 2

## 2017-12-29 MED ORDER — FENTANYL CITRATE (PF) 100 MCG/2ML IJ SOLN
INTRAMUSCULAR | Status: AC
  Filled 2017-12-29: qty 2

## 2017-12-29 MED ORDER — BACITRACIN 50000 UNITS IM SOLR
INTRAMUSCULAR | Status: AC
  Filled 2017-12-29: qty 2

## 2017-12-29 MED ORDER — IPRATROPIUM-ALBUTEROL 0.5-2.5 (3) MG/3ML IN SOLN
3.0000 mL | Freq: Once | RESPIRATORY_TRACT | Status: AC
  Administered 2017-12-29: 3 mL via RESPIRATORY_TRACT

## 2017-12-29 MED ORDER — ACETAMINOPHEN 500 MG PO TABS
1000.0000 mg | ORAL_TABLET | Freq: Once | ORAL | Status: AC
  Administered 2017-12-29: 1000 mg via ORAL

## 2017-12-29 MED ORDER — GLYCOPYRROLATE 0.2 MG/ML IJ SOLN
INTRAMUSCULAR | Status: DC | PRN
  Administered 2017-12-29: 0.2 mg via INTRAVENOUS

## 2017-12-29 MED ORDER — LIDOCAINE HCL (CARDIAC) PF 100 MG/5ML IV SOSY
PREFILLED_SYRINGE | INTRAVENOUS | Status: DC | PRN
  Administered 2017-12-29: 100 mg via INTRAVENOUS

## 2017-12-29 MED ORDER — METOCLOPRAMIDE HCL 5 MG/ML IJ SOLN: 5.0000 mg | Freq: Three times a day (TID) | INTRAMUSCULAR | Status: DC | PRN

## 2017-12-29 MED ORDER — ONDANSETRON HCL 4 MG/2ML IJ SOLN
4.0000 mg | Freq: Four times a day (QID) | INTRAMUSCULAR | Status: DC | PRN
  Administered 2017-12-29: 4 mg via INTRAVENOUS

## 2017-12-29 MED ORDER — BISACODYL 10 MG RE SUPP: 10.0000 mg | Freq: Every day | RECTAL | Status: DC | PRN

## 2017-12-29 MED ORDER — ACETAMINOPHEN 500 MG PO TABS
ORAL_TABLET | ORAL | Status: AC
  Filled 2017-12-29: qty 2

## 2017-12-29 MED ORDER — DEXAMETHASONE SODIUM PHOSPHATE 10 MG/ML IJ SOLN
INTRAMUSCULAR | Status: DC | PRN
  Administered 2017-12-29: 10 mg via INTRAVENOUS

## 2017-12-29 MED ORDER — PHENOL 1.4 % MT LIQD
1.0000 | OROMUCOSAL | Status: DC | PRN
  Filled 2017-12-29: qty 177

## 2017-12-29 MED ORDER — FAMOTIDINE 20 MG PO TABS
20.0000 mg | ORAL_TABLET | Freq: Once | ORAL | Status: AC
  Administered 2017-12-29: 20 mg via ORAL

## 2017-12-29 MED ORDER — LACTATED RINGERS IV SOLN
INTRAVENOUS | Status: DC
  Administered 2017-12-29: 18:00:00 via INTRAVENOUS

## 2017-12-29 MED ORDER — DOCUSATE SODIUM 100 MG PO CAPS
100.0000 mg | ORAL_CAPSULE | Freq: Two times a day (BID) | ORAL | Status: DC
  Administered 2017-12-29 – 2017-12-30 (×2): 100 mg via ORAL
  Filled 2017-12-29 (×2): qty 1

## 2017-12-29 MED ORDER — MORPHINE SULFATE (PF) 2 MG/ML IV SOLN: 0.5000 mg | INTRAVENOUS | Status: DC | PRN

## 2017-12-29 MED ORDER — LACTATED RINGERS IV SOLN
INTRAVENOUS | Status: DC
  Administered 2017-12-29 (×2): via INTRAVENOUS

## 2017-12-29 MED ORDER — BUPIVACAINE HCL (PF) 0.5 % IJ SOLN
INTRAMUSCULAR | Status: AC
  Filled 2017-12-29: qty 10

## 2017-12-29 MED ORDER — FENTANYL CITRATE (PF) 100 MCG/2ML IJ SOLN
INTRAMUSCULAR | Status: DC | PRN
  Administered 2017-12-29 (×2): 50 ug via INTRAVENOUS

## 2017-12-29 MED ORDER — SODIUM CHLORIDE FLUSH 0.9 % IV SOLN
INTRAVENOUS | Status: AC
  Filled 2017-12-29: qty 20

## 2017-12-29 MED ORDER — BUPIVACAINE LIPOSOME 1.3 % IJ SUSP
INTRAMUSCULAR | Status: DC | PRN
  Administered 2017-12-29: 13 mL via PERINEURAL
  Administered 2017-12-29: 7 mL via PERINEURAL

## 2017-12-29 MED ORDER — TRAMADOL HCL 50 MG PO TABS
50.0000 mg | ORAL_TABLET | Freq: Four times a day (QID) | ORAL | Status: DC
  Administered 2017-12-29 – 2017-12-30 (×4): 50 mg via ORAL
  Filled 2017-12-29 (×4): qty 1

## 2017-12-29 MED ORDER — SODIUM CHLORIDE 0.9 % IR SOLN
Status: DC | PRN
  Administered 2017-12-29: 2000 mL

## 2017-12-29 MED ORDER — BUPIVACAINE LIPOSOME 1.3 % IJ SUSP
INTRAMUSCULAR | Status: AC
  Filled 2017-12-29: qty 20

## 2017-12-29 SURGICAL SUPPLY — 63 items
BLADE BOVIE TIP EXT 4 (BLADE) ×2 IMPLANT
BLADE SAW 1 (BLADE) ×2 IMPLANT
BODY ANATOMIC PROXIMAL SZ10 (Shoulder) ×2 IMPLANT
BOWL CEMENT MIX W/ADAPTER (MISCELLANEOUS) ×2 IMPLANT
BRUSH SCRUB EZ  4% CHG (MISCELLANEOUS) ×1
BRUSH SCRUB EZ 4% CHG (MISCELLANEOUS) ×1 IMPLANT
CANISTER SUCT 1200ML W/VALVE (MISCELLANEOUS) ×2 IMPLANT
CANISTER SUCT 3000ML PPV (MISCELLANEOUS) ×4 IMPLANT
CEMENT BONE 1-PACK (Cement) ×2 IMPLANT
CHLORAPREP W/TINT 26ML (MISCELLANEOUS) ×2 IMPLANT
COVER WAND RF STERILE (DRAPES) IMPLANT
CRADLE LAMINECT ARM (MISCELLANEOUS) ×4 IMPLANT
DRAPE IMP U-DRAPE 54X76 (DRAPES) ×4 IMPLANT
DRAPE INCISE IOBAN 66X60 STRL (DRAPES) ×2 IMPLANT
DRAPE SHEET LG 3/4 BI-LAMINATE (DRAPES) ×4 IMPLANT
DRAPE TABLE BACK 80X90 (DRAPES) ×2 IMPLANT
DRAPE U-SHAPE 47X51 STRL (DRAPES) ×2 IMPLANT
DRSG TEGADERM 6X8 (GAUZE/BANDAGES/DRESSINGS) ×2 IMPLANT
ELECT REM PT RETURN 9FT ADLT (ELECTROSURGICAL) ×2
ELECTRODE REM PT RTRN 9FT ADLT (ELECTROSURGICAL) ×1 IMPLANT
GAUZE PETRO XEROFOAM 1X8 (MISCELLANEOUS) ×2 IMPLANT
GAUZE SPONGE 4X4 12PLY STRL (GAUZE/BANDAGES/DRESSINGS) IMPLANT
GLENOID ANCHOR PEG CROSSLK 48 (Orthopedic Implant) ×2 IMPLANT
GLOVE INDICATOR 8.0 STRL GRN (GLOVE) ×2 IMPLANT
GLOVE SURG ORTHO 8.0 STRL STRW (GLOVE) ×2 IMPLANT
GOWN STRL REUS W/ TWL LRG LVL3 (GOWN DISPOSABLE) ×2 IMPLANT
GOWN STRL REUS W/ TWL XL LVL3 (GOWN DISPOSABLE) ×1 IMPLANT
GOWN STRL REUS W/TWL LRG LVL3 (GOWN DISPOSABLE) ×2
GOWN STRL REUS W/TWL XL LVL3 (GOWN DISPOSABLE) ×1
HEAD HUMERAL STND 48X15MM (Head) ×2 IMPLANT
HEAD HUMERALSTD 48X15MM (Head) ×1 IMPLANT
HOOD PEEL AWAY FLYTE STAYCOOL (MISCELLANEOUS) ×6 IMPLANT
IV NS 1000ML (IV SOLUTION) ×1
IV NS 1000ML BAXH (IV SOLUTION) ×1 IMPLANT
KIT STABILIZATION SHOULDER (MISCELLANEOUS) ×2 IMPLANT
KIT TURNOVER KIT A (KITS) ×2 IMPLANT
MASK FACE SPIDER DISP (MASK) ×2 IMPLANT
MAT ABSORB  FLUID 56X50 GRAY (MISCELLANEOUS) ×1
MAT ABSORB FLUID 56X50 GRAY (MISCELLANEOUS) ×1 IMPLANT
NDL SAFETY ECLIPSE 18X1.5 (NEEDLE) IMPLANT
NEEDLE HYPO 18GX1.5 SHARP (NEEDLE)
NEEDLE REVERSE CUT 1/2 CRC (NEEDLE) ×2 IMPLANT
NS IRRIG 1000ML POUR BTL (IV SOLUTION) ×2 IMPLANT
PACK ARTHROSCOPY SHOULDER (MISCELLANEOUS) ×2 IMPLANT
PIN METAGLENE 2.5 (PIN) ×2 IMPLANT
PULSAVAC PLUS IRRIG FAN TIP (DISPOSABLE) ×2
SLING ARM LRG DEEP (SOFTGOODS) ×2 IMPLANT
SPONGE LAP 18X18 RF (DISPOSABLE) ×2 IMPLANT
STAPLER SKIN PROX 35W (STAPLE) ×2 IMPLANT
STEM STANDARD SZ 10 113MM (Stem) ×2 IMPLANT
STEM STD SZ 10 113MM (Stem) ×1 IMPLANT
STRAP SAFETY 5IN WIDE (MISCELLANEOUS) ×2 IMPLANT
SUT TICRON 2-0 30IN 311381 (SUTURE) IMPLANT
SUT VIC AB 0 CT2 27 (SUTURE) ×2 IMPLANT
SUT VIC AB 2-0 CT1 18 (SUTURE) ×4 IMPLANT
SUT VIC AB PLUS 45CM 1-MO-4 (SUTURE) IMPLANT
SYR 10ML LL (SYRINGE) ×2 IMPLANT
SYR TOOMEY 50ML (SYRINGE) ×2 IMPLANT
SYRINGE IRR TOOMEY STRL 70CC (SYRINGE) ×2 IMPLANT
TAPE SUT 30 1/2 CRC GREEN (SUTURE) ×4 IMPLANT
TAPE TRANSPORE STRL 2 31045 (GAUZE/BANDAGES/DRESSINGS) ×2 IMPLANT
TIP FAN IRRIG PULSAVAC PLUS (DISPOSABLE) ×1 IMPLANT
WATER STERILE IRR 1000ML POUR (IV SOLUTION) ×4 IMPLANT

## 2017-12-29 NOTE — Anesthesia Post-op Follow-up Note (Signed)
Anesthesia QCDR form completed.        

## 2017-12-29 NOTE — Anesthesia Procedure Notes (Signed)
Procedure Name: Intubation Date/Time: 12/29/2017 10:50 AM Performed by: Nelda Marseille, CRNA Pre-anesthesia Checklist: Patient identified, Patient being monitored, Timeout performed, Emergency Drugs available and Suction available Patient Re-evaluated:Patient Re-evaluated prior to induction Oxygen Delivery Method: Circle system utilized Preoxygenation: Pre-oxygenation with 100% oxygen Induction Type: IV induction Ventilation: Mask ventilation without difficulty Laryngoscope Size: Mac and 3 Grade View: Grade I Tube type: Oral Tube size: 7.5 mm Number of attempts: 1 Airway Equipment and Method: Stylet Placement Confirmation: ETT inserted through vocal cords under direct vision,  positive ETCO2 and breath sounds checked- equal and bilateral Secured at: 21 cm Tube secured with: Tape Dental Injury: Teeth and Oropharynx as per pre-operative assessment

## 2017-12-29 NOTE — Op Note (Signed)
12/29/2017  2:03 PM  Patient:   Jacob Rose  Pre-Op Diagnosis:   Traumatic arthritis, left shoulder.  Post-Op Diagnosis:   Same.  Procedure:   Left total shoulder arthroplasty.  Surgeon:   Kurtis Bushman, MD  Assistant:  Carlynn Spry, PA-C  Anesthesia:   General endotracheal intubation with an interscalene block.  Findings:   As above. The rotator cuff was in satisfactory condition.  Complications:   None  EBL:  200 cc  Drains:   None  Closure:   Staples  Implants:   [employer] system with a standard stem size 10  With a 135 degree porocoat proximal body size 10, a 48 x 15 mm humeral head, and a cemented glenoid component size 48 mm.  Brief Clinical Note:   The patient is a 57 year old man with chronic and severe shoulder pain that occurred after an injury at work. The patient presents at this time for a left total shoulder arthroplasty.  Procedure:   The patient was brought into the operating room and lain in the supine position on the OR table. After adequate IV sedation was achieved, an interscalene block was placed by the anesthesiologist. The patient then underwent general endotracheal intubation and anesthesia before being repositioned in the beach chair position using the beach chair positioner. A Foley catheter was placed by the nurse. The left shoulder and upper extremity were prepped with ChloraPrep solution before being draped sterilely. Preoperative antibiotics were administered. A standard anterior approach to the shoulder was made through an approximately 4-5 inch incision. The incision was carried down through the subcutaneous tissues to expose the deltopectoral fascia. The interval between the deltoid and pectoralis muscles was identified and this plane developed, retracting the cephalic vein laterally with the deltoid muscle. The conjoined tendon was identified. The lateral margin was dissected and the Kolbel self-retraining retractor inserted. The "three  sisters" were identified and cauterized. Bursal tissues were removed to improve visualization. The subscapularis tendon was released from its attachment to the lesser tuberosity 1 cm proximal to its insertion and several tagging sutures placed. The inferior capsule was released with care after identifying and protecting the axillary nerve. The proximal humeral cut was made at approximately 30 of retroversion using the extra-medullary guide.   Attention was directed to the glenoid. The labrum was debrided circumferentially before the center of the glenoid was marked with electrocautery. The small and medium sizers were positioned and it was elected to proceed with a small glenoid component. The guidewire was drilled into the glenoid neck using the appropriate guide. After verifying its position, the glenoid was lightly reamed with the butterfly reamer before the centralizing reamer was used. The small peripheral peg guide was positioned and each of the three pegs drilled sequentially, leaving a peg in place so as to minimize shifting of the guide. The bony surfaces were prepared for cementing by irrigating them thoroughly with bacitracin saline solution using the jet lavage system. Meanwhile, cement was mixed on the back table. When it was ready, some cement was injected into each of the three peg holes using a Toomey syringe. The component was impacted into place and the excess cement was removed. Pressure was maintained on the glenoid until the cement hardened.  Attention was directed to the humeral side. The humeral canal was reamed sequentially beginning with the end-cutting reamer then progressing up to a 10 mm reamer. This provided excellent circumferential chatter. The canal was prepared using a brosteotome. A trial reduction  performed. The arm demonstrated excellent range of motion as the hand could be brought across the chest to the opposite shoulder and brought to the top of the patient's head and to  the patient's ear. The shoulder remained stable throughout this range of motion, and was stable with abduction and external rotation. The joint was dislocated and the trial components removed. The final components were impacted into place with care taken to maintain the appropriate version. Again, the [facility] taper locking mechanism was verified using manual distraction. The shoulder was relocated and again placed through a range of motion with the findings as described above.  The wound was copiously irrigated with bacitracin saline solution using the jet lavage system. The subscapularis tendon was reapproximated using 62mm cottony Dacron sutures. The deltopectoral interval was closed using #0 Vicryl interrupted sutures before the subcutaneous tissues were closed using 2-0 Vicryl interrupted sutures. The skin was closed using staples. A sterile occlusive dressing was applied to the wound before the arm was placed into a sling. The patient was then transferred back to a hospital bed before being awakened, extubated, and returned to the recovery room in satisfactory condition after tolerating the procedure well.

## 2017-12-29 NOTE — Anesthesia Procedure Notes (Signed)
Anesthesia Regional Block: Interscalene brachial plexus block   Pre-Anesthetic Checklist: ,, timeout performed, Correct Patient, Correct Site, Correct Laterality, Correct Procedure, Correct Position, site marked, Risks and benefits discussed,  Surgical consent,  Pre-op evaluation,  At surgeon's request and post-op pain management  Laterality: Upper and Left  Prep: chloraprep       Needles:  Injection technique: Single-shot  Needle Type: Stimiplex     Needle Length: 5cm  Needle Gauge: 22     Additional Needles:   Procedures:,,,, ultrasound used (permanent image in chart),,,,  Narrative:  Start time: 12/29/2017 9:10 AM End time: 12/29/2017 9:17 AM Injection made incrementally with aspirations every 5 mL.  Performed by: Personally  Anesthesiologist: Piscitello, Precious Haws, MD  Additional Notes: Patient consented for risk of nerve block including but not limited to permament nerve injury, damage to surrounding structures or failed block.  Patient voiced understanding.  Patient endorses some baseline weakness in left shoulder.  Functioning IV was confirmed and monitors were applied.  A 39mm 22ga Stimuplex needle was used. Sterile prep,hand hygiene and sterile gloves were used.  Minimal sedation used for procedure.  No paresthesia endorsed by patient during the procedure.  Negative aspiration and negative test dose prior to incremental administration of local anesthetic. The patient tolerated the procedure well with no immediate complications.
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
## 2017-12-29 NOTE — Anesthesia Postprocedure Evaluation (Signed)
Anesthesia Post Note  Patient: [name]  Procedure(s) Performed: TOTAL SHOULDER ARTHROPLASTY (Left Shoulder)  Patient location during evaluation: PACU Anesthesia Type: General Level of consciousness: awake and alert Pain management: pain level controlled Vital Signs Assessment: post-procedure vital signs reviewed and stable Respiratory status: spontaneous breathing, nonlabored ventilation, respiratory function stable and patient connected to nasal cannula oxygen Cardiovascular status: blood pressure returned to baseline and stable Postop Assessment: no apparent nausea or vomiting Anesthetic complications: no     Last Vitals:  Vitals:   12/29/17 1449 12/29/17 1549  BP:    Pulse:    Resp: 20   Temp:  36.8 C  SpO2:      Last Pain:  Vitals:   12/29/17 1549  TempSrc:   PainSc: Asleep                 Precious Haws Piscitello

## 2017-12-29 NOTE — Transfer of Care (Signed)
Immediate Anesthesia Transfer of Care Note  Patient: Jacob Rose  Procedure(s) Performed: TOTAL SHOULDER ARTHROPLASTY (Left Shoulder)  Patient Location: PACU  Anesthesia Type:General  Level of Consciousness: sedated  Airway & Oxygen Therapy: Patient Spontanous Breathing and Patient connected to face mask oxygen  Post-op Assessment: Report given to RN and Post -op Vital signs reviewed and stable  Post vital signs: Reviewed and stable  Last Vitals:  Vitals Value Taken Time  BP    Temp    Pulse 102 12/29/2017  1:49 PM  Resp    SpO2 100 % 12/29/2017  1:49 PM  Vitals shown include unvalidated device data.  Last Pain:  Vitals:   12/29/17 0818  TempSrc: Temporal  PainSc: 0-No pain         Complications: No apparent anesthesia complications
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
## 2017-12-30 ENCOUNTER — Encounter: Payer: Self-pay | Admitting: Orthopedic Surgery

## 2017-12-30 LAB — BASIC METABOLIC PANEL
ANION GAP: 8 (ref 5–15)
BUN: 14 mg/dL (ref 6–20)
CHLORIDE: 100 mmol/L (ref 98–111)
CO2: 27 mmol/L (ref 22–32)
CREATININE: 0.9 mg/dL (ref 0.61–1.24)
Calcium: 8.5 mg/dL — ABNORMAL LOW (ref 8.9–10.3)
GFR calc non Af Amer: >60 mL/min (ref >60)
Glucose, Bld: 168 mg/dL — ABNORMAL HIGH (ref 70–99)
Potassium: 4.5 mmol/L (ref 3.5–5.1)
Sodium: 135 mmol/L (ref 135–145)

## 2017-12-30 LAB — HEMOGLOBIN AND HEMATOCRIT, BLOOD
HCT: 36.9 % — ABNORMAL LOW (ref 39.0–52.0)
Hemoglobin: 12.4 g/dL — ABNORMAL LOW (ref 13.0–17.0)

## 2017-12-30 MED ORDER — OXYCODONE HCL 5 MG PO TABS: 5.0000 mg | ORAL_TABLET | ORAL | 0 refills | Status: AC | PRN

## 2017-12-30 MED ORDER — DOCUSATE SODIUM 100 MG PO CAPS: 100.0000 mg | ORAL_CAPSULE | Freq: Two times a day (BID) | ORAL | 0 refills | Status: DC

## 2017-12-30 NOTE — Evaluation (Signed)
Occupational Therapy Evaluation Patient Details Name: Jacob Rose MRN: [id number] DOB: [birth year] Today's Date: 12/30/2017    History of Present Illness Pt POD 1 for L total shoulder arthroplasty (12/29/2017). PMH: arthritis, CAD, GOUT, HTN, sleep apnea, dyspnea, and hypercholesteremia   Clinical Impression   Patient was seen for an OT evaluation this date.  Pt eager for OT session and got dressed during session.  At rest: O2 99%, HR 80; After bed>chair transfer O2 97%, HR 70; In recliner after walking around nursing station: O2 98%, HR 66. RN notified of decreasing HR after ambulation. Prior to surgery, pt was active and independent. Pt's spouse is available for 12 days 24/7 during pt recovery. Pt has orders for LUE to be immobilized and will be NWBing per MD. Patient demonstrates deficits (see "OT problem List" below) compared to PLOF.   These impairments result in a decreased ability to perform self care tasks requiring MIN A for UB/LB dressing and bathing and max assist for application of polar care and sling. Pt/spouse instructed in polar care mgt, sling mgt,  LUE precautions, adaptive strategies for bathing/dressing/toileting/grooming, positioning and considerations for sleep, and home/routines modifications to maximize falls prevention, safety, and independence. Handout provided. OT adjusted sling/immobilizer and polar care to improve comfort, optimize positioning, and to maximize skin integrity/safety. Pt/spouse verbalized understanding of all education/training provided. No further skilled OT services required. Will sign off.   Follow Up Recommendations  Follow surgeon's recommendation for DC plan and follow-up therapies    Equipment Recommendations  None recommended by OT    Recommendations for Other Services       Precautions / Restrictions Precautions Precautions: Shoulder Type of Shoulder Precautions: non weight bearing LUE; No AROM/PROM in L shoulder; AROM for elbow,  wrist, and hands  Shoulder Interventions: Shoulder sling/immobilizer Precaution Booklet Issued: Yes (comment) Required Braces or Orthoses: Sling Restrictions Weight Bearing Restrictions: Yes      Mobility Bed Mobility Overal bed mobility: Modified Independent             General bed mobility comments: Increased time/effort   Transfers Overall transfer level: Modified independent               General transfer comment: Increased time/effort    Balance Overall balance assessment: Independent                                         ADL either performed or assessed with clinical judgement   ADL Overall ADL's : Needs assistance/impaired                                       General ADL Comments: Pt MIN A for UB/LB bathing and dressing tasks.      Vision Baseline Vision/History: Wears glasses Wears Glasses: At all times Patient Visual Report: No change from baseline       Perception     Praxis      Pertinent Vitals/Pain Pain Assessment: 0-10 Pain Score: 7  Pain Location: at rest 5; while adjusting sling 7; back in recliner 6 Pain Descriptors / Indicators: (dull) Pain Intervention(s): Limited activity within patient's tolerance;Monitored during session;Repositioned;Ice applied;Premedicated before session     Hand Dominance Right   Extremity/Trunk Assessment Upper Extremity Assessment Upper Extremity Assessment: LUE deficits/detail LUE Deficits / Details: post op shoulder,  elbow/wrist/hand ROM WFL, sensation intact LUE: Unable to fully assess due to immobilization   Lower Extremity Assessment Lower Extremity Assessment: Overall WFL for tasks assessed   Cervical / Trunk Assessment Cervical / Trunk Assessment: Normal   Communication Communication Communication: No difficulties   Cognition Arousal/Alertness: Awake/alert Behavior During Therapy: WFL for tasks assessed/performed Overall Cognitive Status: Within  Functional Limits for tasks assessed                                     General Comments  At rest: O2 99%, HR 80; After bed>chair transfer O2 97%, HR 70; In recliner after walking around nursing station: O2 98%, HR 66. RN notified of decreasing HR after ambulation    Exercises Other Exercises Other Exercises: Pt/spouse educated in sling mgt including donning/doffing, wear schedule and positioining Other Exercises: Pt/spouse educated in polar care mgt including donning/doffing, wear schedule and positioining Other Exercises: Pt/spouse educated in use of AE for LB dressing Other Exercises: Pt/spouse educated in shoulder precautions including positioning and movement   Shoulder Instructions      Home Living Family/patient expects to be discharged to:: Private residence Living Arrangements: Spouse/significant other Available Help at Discharge: Family;Available 24 hours/day(for 10-12 days) Type of Home: House Home Access: Stairs to enter [employer] of Steps: 5 Entrance Stairs-Rails: Can reach both Home Layout: One level     Bathroom Shower/Tub: Occupational psychologist: Standard     Home Equipment: None          Prior Functioning/Environment Level of Independence: Independent        Comments: Independent in all ADL/IADL tasks and mobility; driving; works as a Administrator,         OT Problem List: Decreased range of motion;Decreased strength;Pain;Impaired UE functional use;Decreased knowledge of use of DME or AE;Decreased knowledge of precautions      OT Treatment/Interventions:      OT Goals(Current goals can be found in the care plan section) Acute Rehab OT Goals Patient Stated Goal: to go home OT Goal Formulation: All assessment and education complete, DC therapy Potential to Achieve Goals: Good  OT Frequency:     Barriers to D/C:            Co-evaluation              AM-PAC PT "6 Clicks" Daily Activity      Outcome Measure Help from another person eating meals?: None Help from another person taking care of personal grooming?: A Little Help from another person toileting, which includes using toliet, bedpan, or urinal?: None Help from another person bathing (including washing, rinsing, drying)?: A Little Help from another person to put on and taking off regular upper body clothing?: A Little Help from another person to put on and taking off regular lower body clothing?: A Little 6 Click Score: 22   End of Session Equipment Utilized During Treatment: Gait belt  Activity Tolerance: Patient tolerated treatment well Patient left: in chair;with call bell/phone within reach;with chair alarm set;with SCD's reapplied;with family/visitor present  OT Visit Diagnosis: Other abnormalities of gait and mobility (R26.89);Pain Pain - Right/Left: Left Pain - part of body: Shoulder                Time: 1655-3748 OT Time Calculation (min): 60 min Charges:     Jadene Pierini OTS  12/30/2017, 11:25 AM

## 2017-12-30 NOTE — Care Management (Signed)
This RNCM received call from patient's wife. Follow up appointments given to her for [facility] and appointment with Dr. Harlow Mares that is also on discharge follow up for patient. No RNCM needs per wife.

## 2017-12-30 NOTE — Discharge Summary (Signed)
Physician Discharge Summary  Patient ID: Jacob Rose MRN: [id number] DOB/AGE: [birth year] 57 y.o.  Admit date: 12/29/2017 Discharge date: 12/30/2017  Admission Diagnoses:  post traumatic osteoarthritis <principal problem not specified>  Discharge Diagnoses:  post traumatic osteoarthritis Active Problems:   Degenerative arthritis of left shoulder region   Past Medical History:  Diagnosis Date  . Arthritis    osteoarthritis  . Coronary artery disease   . Gout    recent outbreak 12/2017. taking prednisone briefly to treat along with allopurinol  . Hypercholesteremia   . Hypertension   . Low testosterone in male   . Shortness of breath dyspnea 12/2017   DOE  . Sleep apnea    does not use cpap, had previous surgery to fix sleep apnea    Surgeries: Procedure(s): TOTAL SHOULDER ARTHROPLASTY on 12/29/2017   Consultants (if any):   Discharged Condition: Improved  Hospital Course: Jacob Rose is an 57 y.o. male who was admitted 12/29/2017 with a diagnosis of  post traumatic osteoarthritis <principal problem not specified> and went to the operating room on 12/29/2017 and underwent the above named procedures.    He was given perioperative antibiotics:  Anti-infectives (From admission, onward)   Start     Dose/Rate Route Frequency Ordered Stop   12/29/17 1700  ceFAZolin (ANCEF) IVPB 2g/100 mL premix     2 g 200 mL/hr over 30 Minutes Intravenous Every 6 hours 12/29/17 1612 12/30/17 0620   12/29/17 1127  50,000 units bacitracin in 0.9% normal saline 250 mL irrigation  Status:  Discontinued       As needed 12/29/17 1127 12/29/17 1345   12/29/17 0759  ceFAZolin (ANCEF) 2-4 GM/100ML-% IVPB    Note to Pharmacy:  Veryl Speak   : cabinet override      12/29/17 0759 12/29/17 1049   12/29/17 0600  ceFAZolin (ANCEF) IVPB 2g/100 mL premix     2 g 200 mL/hr over 30 Minutes Intravenous On call to O.R. 12/28/17 2238 12/29/17 1059    .  He was given sequential compression  devices, early ambulation for DVT prophylaxis.  He benefited maximally from the hospital stay and there were no complications.    Recent vital signs:  Vitals:   12/30/17 0350 12/30/17 0752  BP: 91/64   Pulse: 65   Resp: 18   Temp: 97.7 F (36.5 C)   SpO2: 99% 99%    Recent laboratory studies:  Lab Results  Component Value Date   HGB 12.4 (L) 12/30/2017   HGB 15.0 12/14/2017   HGB 13.7 03/17/2015   Lab Results  Component Value Date   WBC 15.0 (H) 12/14/2017   PLT 230 12/14/2017   Lab Results  Component Value Date   INR 0.93 12/14/2017   Lab Results  Component Value Date   NA 135 12/30/2017   K 4.5 12/30/2017   CL 100 12/30/2017   CO2 27 12/30/2017   BUN 14 12/30/2017   CREATININE 0.90 12/30/2017   GLUCOSE 168 (H) 12/30/2017    Discharge Medications:   Allergies as of 12/30/2017      Reactions   Flexeril [cyclobenzaprine] Other (See Comments)   Bad dreams      Medication List    STOP taking these medications   ibuprofen 200 MG tablet Commonly known as:  ADVIL,MOTRIN     TAKE these medications   allopurinol 300 MG tablet Commonly known as:  ZYLOPRIM Take 300 mg by mouth at bedtime.   docusate sodium 100 MG capsule Commonly  known as:  COLACE Take 1 capsule (100 mg total) by mouth 2 (two) times daily.   isosorbide mononitrate 30 MG 24 hr tablet Commonly known as:  IMDUR Take 1 tablet (30 mg total) by mouth daily.   lisinopril 10 MG tablet Commonly known as:  PRINIVIL,ZESTRIL Take 10 mg by mouth at bedtime.   oxyCODONE 5 MG immediate release tablet Commonly known as:  Oxy IR/ROXICODONE Take 1 tablet (5 mg total) by mouth every 4 (four) hours as needed.   simvastatin 40 MG tablet Commonly known as:  ZOCOR Take 40 mg by mouth at bedtime.       Diagnostic Studies: Ct Shoulder Left Wo Contrast  Result Date: 12/21/2017 CLINICAL DATA:  Pain, limited range of motion for 1 year. EXAM: CT OF THE UPPER LEFT EXTREMITY WITHOUT CONTRAST TECHNIQUE:  Multidetector CT imaging of the upper left extremity was performed according to the standard protocol. COMPARISON:  None. FINDINGS: Bones/Joint/Cartilage No fracture or dislocation. Normal alignment. No joint effusion. Moderate-severe left glenohumeral joint space narrowing with subchondral cystic changes and marginal osteophytes. No significant loss of glenoid bone stock. Mild arthropathy of the acromioclavicular joint.  Type I acromion. No periosteal reaction or bone destruction. No aggressive osseous lesion. Ligaments Ligaments are suboptimally evaluated by CT. Muscles and Tendons Muscles are normal. No muscle atrophy. No intramuscular fluid collection or hematoma. Soft tissue No fluid collection or hematoma. No soft tissue mass. Coronary artery atherosclerosis in the LAD. IMPRESSION: 1. Moderate-severe osteoarthritis of the left glenohumeral joint. 2.  No acute osseous injury of the left shoulder. Electronically Signed   By: Kathreen Devoid   On: 12/21/2017 09:58   Dg Shoulder Left Port  Result Date: 12/29/2017 CLINICAL DATA:  Status post LEFT total shoulder arthroplasty EXAM: LEFT SHOULDER - 1 VIEW COMPARISON:  12/21/2017 CT FINDINGS: LEFT shoulder arthroplasty changes noted. No definite complicating features noted. No dislocation or acute fracture. IMPRESSION: LEFT shoulder arthroplasty changes without definite complicating features. Electronically Signed   By: Margarette Canada M.D.   On: 12/29/2017 14:35    Disposition:        Signed: Lovell Sheehan ,MD 12/30/2017, 8:07 AM

## 2017-12-30 NOTE — Discharge Instructions (Signed)
Wear sling at all times, including sleep.  You will need to use the sling for a total of 4 weeks following surgery. ° °Do not try and lift your arm up or away from your body for any reason.  ° °Keep the dressing dry.  You may remove bandage in 3 days.  You may place a Band-Aid over top of the incision. ° °May shower once dressing is removed in 3 days.  Remove sling carefully only for showers, leaving arm down by your side while in the shower. ° °+++ Make sure to take some pain medication this evening before you fall asleep, in preparation for the nerve block wearing off in the middle of the night. ° °If the the pain medication causes itching, or is too strong, try taking a single tablet at a time, or combining with Benadryl. ° °You may be most comfortable sleeping in a recliner.  If you do sleep in near bed, placed pillows behind the shoulder that have the operation to support it. °

## 2017-12-30 NOTE — Care Management (Signed)
Outpatient PT arranged at [facility] on 01/05/18 at 0830AM and then on 01/11/18 0920AM follow up with Dr Harlow Mares.

## 2017-12-31 LAB — SURGICAL PATHOLOGY

## 2018-05-19 DIAGNOSIS — E781 Pure hyperglyceridemia: Secondary | ICD-10-CM | POA: Diagnosis not present

## 2018-05-19 DIAGNOSIS — I10 Essential (primary) hypertension: Secondary | ICD-10-CM | POA: Diagnosis not present

## 2018-05-19 DIAGNOSIS — R739 Hyperglycemia, unspecified: Secondary | ICD-10-CM | POA: Diagnosis not present

## 2018-05-25 DIAGNOSIS — G4733 Obstructive sleep apnea (adult) (pediatric): Secondary | ICD-10-CM | POA: Diagnosis not present

## 2018-05-25 DIAGNOSIS — I519 Heart disease, unspecified: Secondary | ICD-10-CM | POA: Diagnosis not present

## 2018-05-25 DIAGNOSIS — I10 Essential (primary) hypertension: Secondary | ICD-10-CM | POA: Diagnosis not present

## 2018-05-25 DIAGNOSIS — R748 Abnormal levels of other serum enzymes: Secondary | ICD-10-CM | POA: Diagnosis not present

## 2018-05-27 ENCOUNTER — Other Ambulatory Visit (HOSPITAL_COMMUNITY): Payer: Self-pay | Admitting: Internal Medicine

## 2018-05-27 ENCOUNTER — Other Ambulatory Visit: Payer: Self-pay | Admitting: Internal Medicine

## 2018-05-27 DIAGNOSIS — R748 Abnormal levels of other serum enzymes: Secondary | ICD-10-CM

## 2018-06-02 DIAGNOSIS — R748 Abnormal levels of other serum enzymes: Secondary | ICD-10-CM | POA: Diagnosis not present

## 2018-06-07 ENCOUNTER — Other Ambulatory Visit: Payer: Self-pay

## 2018-06-07 ENCOUNTER — Ambulatory Visit
Admission: RE | Admit: 2018-06-07 | Discharge: 2018-06-07 | Disposition: A | Discharge status: Home / Self Care | Payer: 59 | Source: Ambulatory Visit | Attending: Internal Medicine | Admitting: Internal Medicine

## 2018-06-07 DIAGNOSIS — R748 Abnormal levels of other serum enzymes: Secondary | ICD-10-CM

## 2018-06-07 DIAGNOSIS — K7689 Other specified diseases of liver: Secondary | ICD-10-CM | POA: Diagnosis not present

## 2018-06-27 DIAGNOSIS — D100 Benign neoplasm of lip: Secondary | ICD-10-CM | POA: Diagnosis not present

## 2019-03-21 IMAGING — CT CT SHOULDER*L* W/O CM
2 of 3 series · 14 of 27 positions shown, 18 images · non-contrast
Comparison: None.

CLINICAL DATA: Pain, limited range of motion for 1 year.

EXAM:
CT OF THE UPPER LEFT EXTREMITY WITHOUT CONTRAST
TECHNIQUE: Multidetector CT imaging of the upper left extremity was performed
according to the standard protocol.

[Series 2: thin bone · axial · 0.49mm/px · z∈[-409,-260]mm · 9 of 373 slices shown, 12 images]
[im 38/373  soft-tissue]
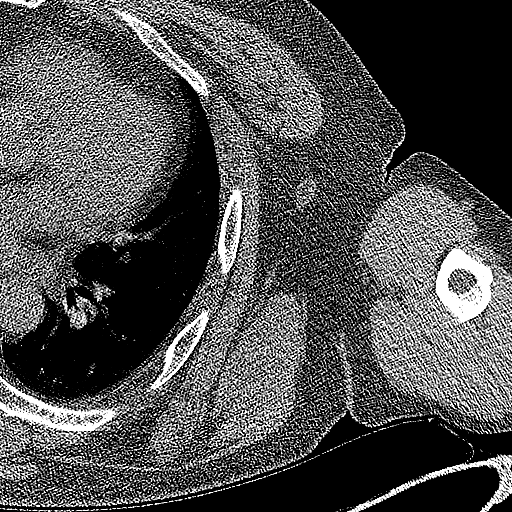
[im 38/373  bone]
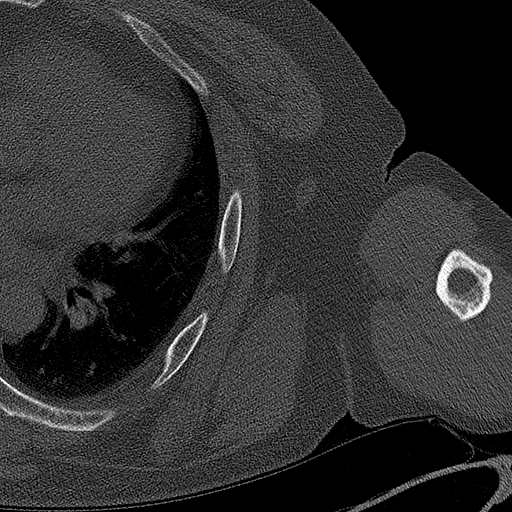
[im 75/373  bone]
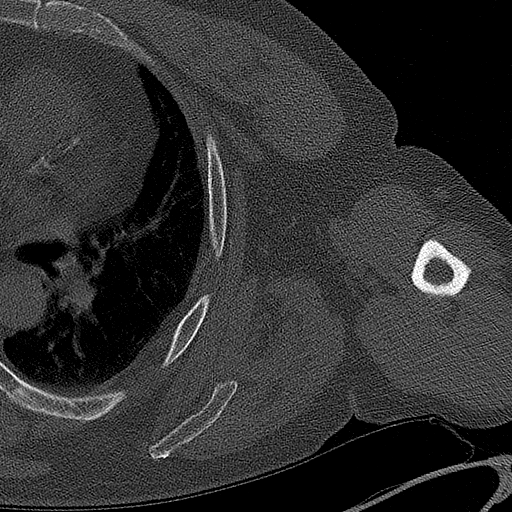
[im 112/373  bone]
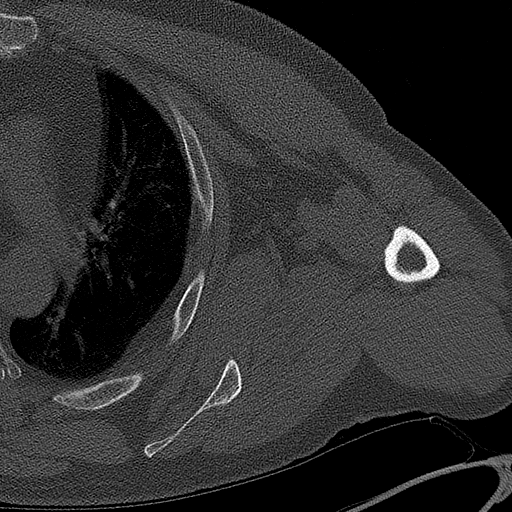
[im 149/373  bone]
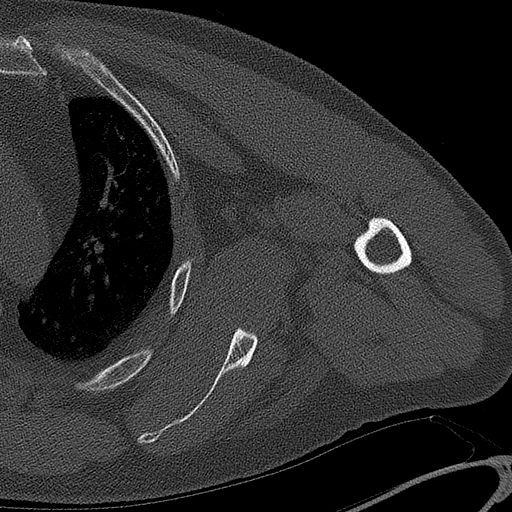
[im 187/373  soft-tissue]
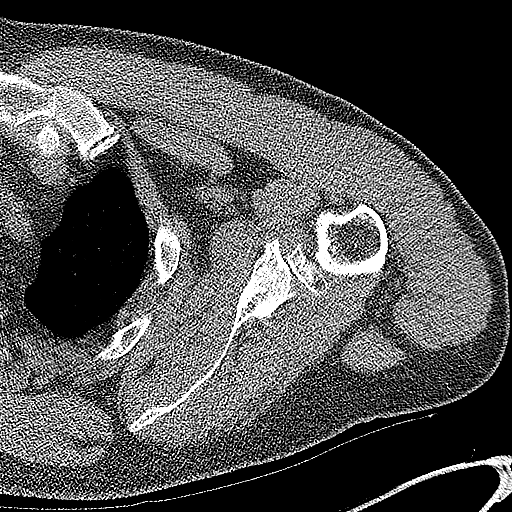
[im 187/373  bone]
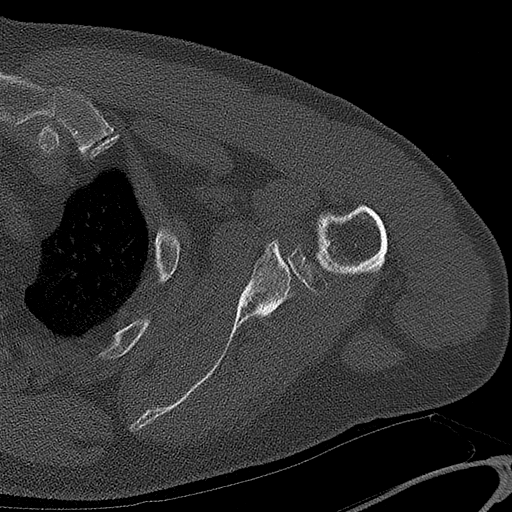
[im 224/373  bone]
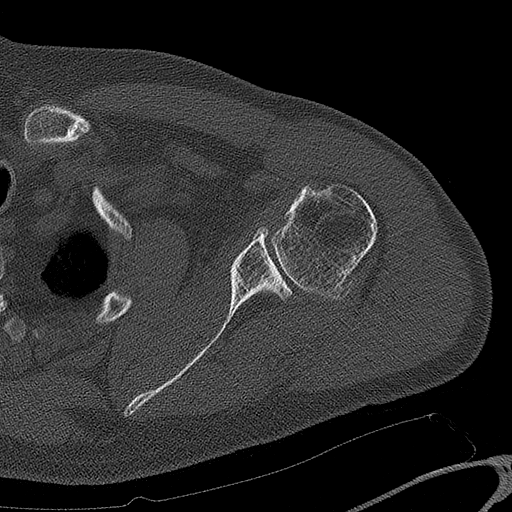
[im 261/373  bone]
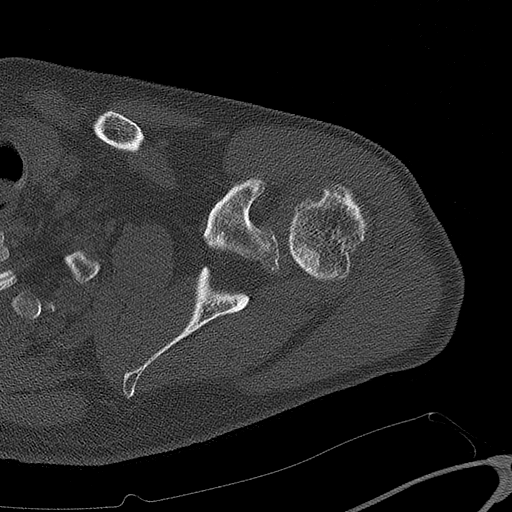
[im 298/373  bone]
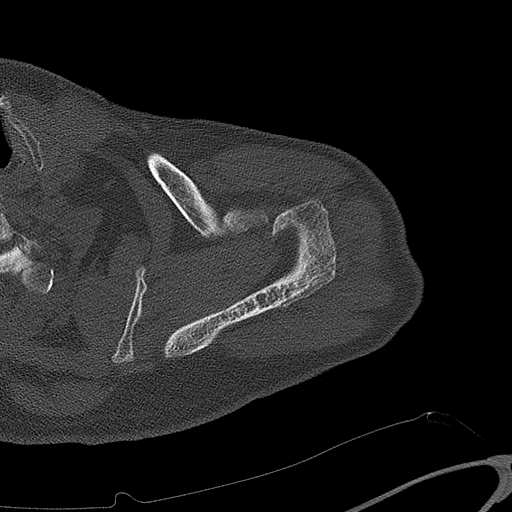
[im 335/373  soft-tissue]
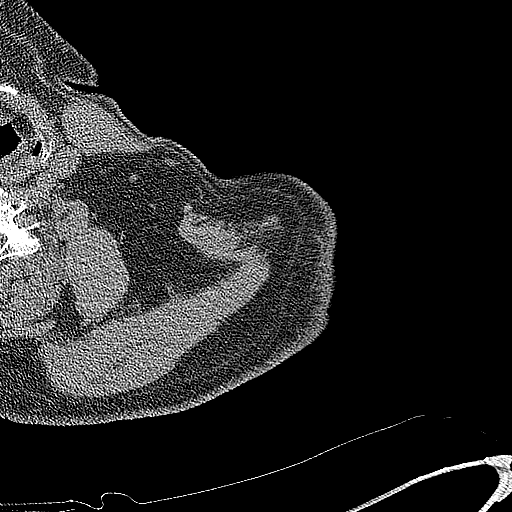
[im 335/373  bone]
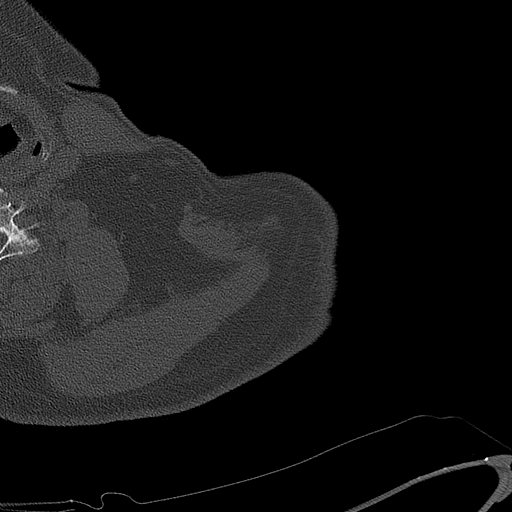

[Series 9: sag st · sagittal · 0.40mm/px · 5 of 112 slices shown, 6 images]
[im 38/112  bone]
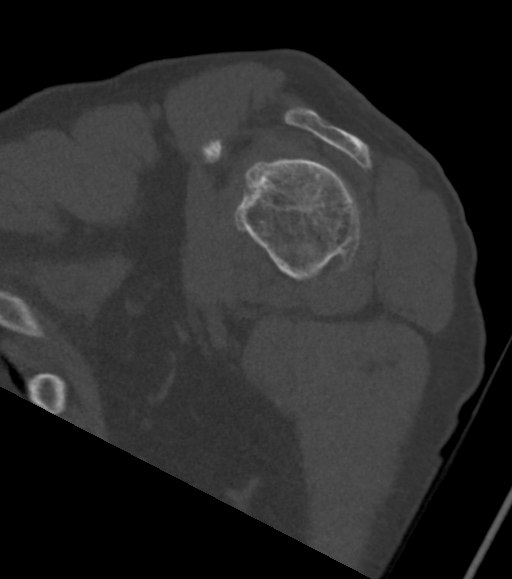
[im 47/112  bone]
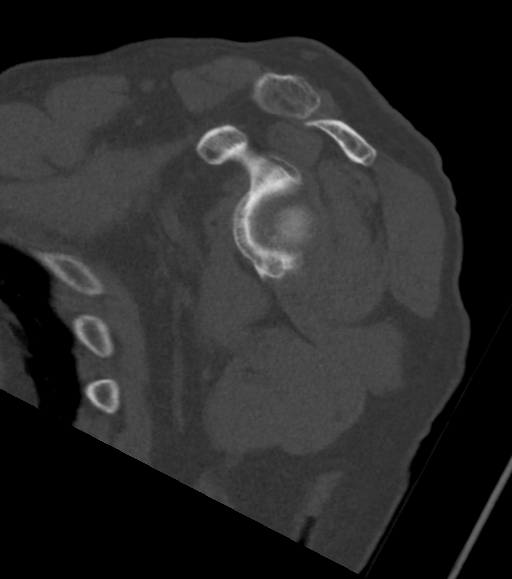
[im 56/112  soft-tissue]
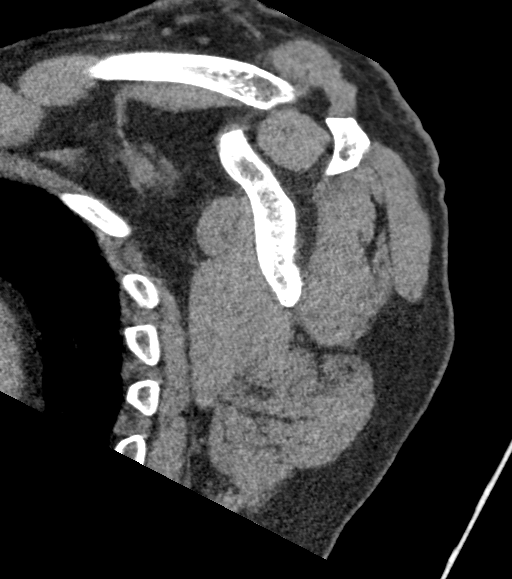
[im 56/112  bone]
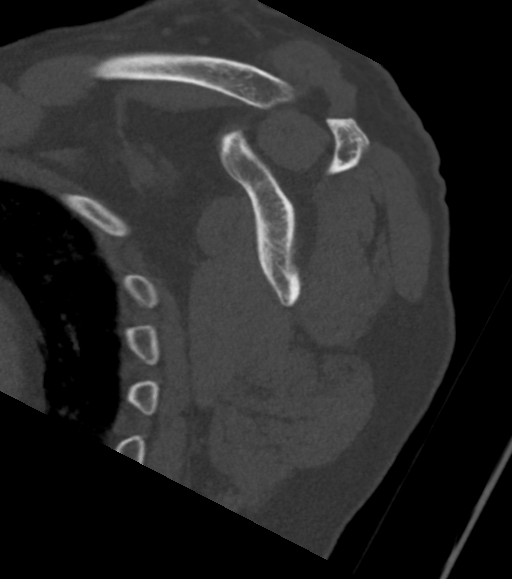
[im 65/112  bone]
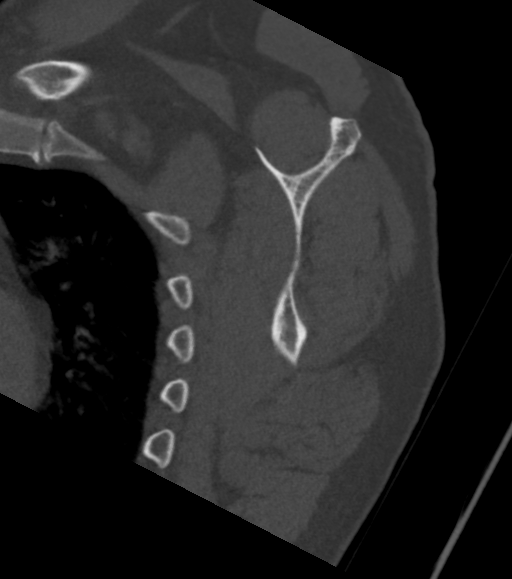
[im 75/112  bone]
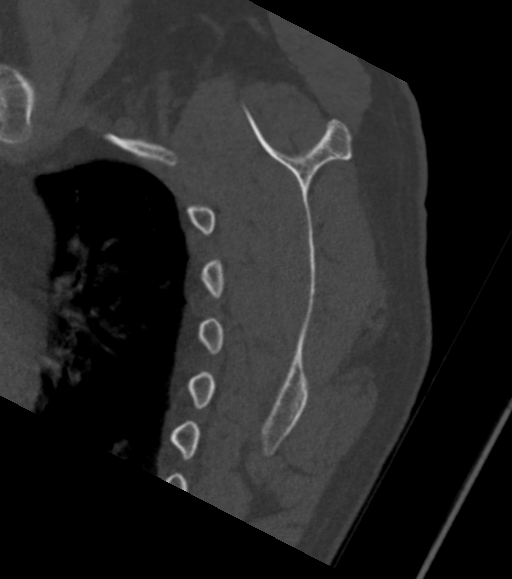

[14 of 27 positions shown; findings below may reference images not displayed]

FINDINGS: Bones/Joint/Cartilage

No fracture or dislocation. Normal alignment. No joint effusion.

Moderate-severe left glenohumeral joint space narrowing with
subchondral cystic changes and marginal osteophytes. No significant
loss of glenoid bone stock.

Mild arthropathy of the acromioclavicular joint.  Type I acromion.

No periosteal reaction or bone destruction. No aggressive osseous
lesion.

Ligaments

Ligaments are suboptimally evaluated by CT.

Muscles and Tendons
Muscles are normal. No muscle atrophy. No intramuscular fluid
collection or hematoma.

Soft tissue
No fluid collection or hematoma. No soft tissue mass. Coronary
artery atherosclerosis in the LAD.
IMPRESSION: 1. Moderate-severe osteoarthritis of the left glenohumeral joint.
2.  No acute osseous injury of the left shoulder.

## 2020-03-29 ENCOUNTER — Emergency Department: Payer: Commercial Managed Care - HMO

## 2020-03-29 ENCOUNTER — Other Ambulatory Visit: Payer: Self-pay

## 2020-03-29 ENCOUNTER — Encounter: Payer: Self-pay | Admitting: Emergency Medicine

## 2020-03-29 ENCOUNTER — Inpatient Hospital Stay
Admission: EM | Admit: 2020-03-29 | Discharge: 2020-04-02 | DRG: 229 | Disposition: A | Discharge status: Home / Self Care | Payer: Commercial Managed Care - HMO | Attending: Internal Medicine | Admitting: Internal Medicine

## 2020-03-29 DIAGNOSIS — E785 Hyperlipidemia, unspecified: Secondary | ICD-10-CM | POA: Diagnosis present

## 2020-03-29 DIAGNOSIS — Z79899 Other long term (current) drug therapy: Secondary | ICD-10-CM | POA: Diagnosis not present

## 2020-03-29 DIAGNOSIS — E7800 Pure hypercholesterolemia, unspecified: Secondary | ICD-10-CM | POA: Diagnosis present

## 2020-03-29 DIAGNOSIS — N179 Acute kidney failure, unspecified: Secondary | ICD-10-CM | POA: Diagnosis present

## 2020-03-29 DIAGNOSIS — Z20822 Contact with and (suspected) exposure to covid-19: Secondary | ICD-10-CM | POA: Diagnosis present

## 2020-03-29 DIAGNOSIS — N183 Chronic kidney disease, stage 3 unspecified: Secondary | ICD-10-CM | POA: Diagnosis present

## 2020-03-29 DIAGNOSIS — I130 Hypertensive heart and chronic kidney disease with heart failure and stage 1 through stage 4 chronic kidney disease, or unspecified chronic kidney disease: Secondary | ICD-10-CM | POA: Diagnosis present

## 2020-03-29 DIAGNOSIS — R7989 Other specified abnormal findings of blood chemistry: Secondary | ICD-10-CM

## 2020-03-29 DIAGNOSIS — E119 Type 2 diabetes mellitus without complications: Secondary | ICD-10-CM | POA: Diagnosis not present

## 2020-03-29 DIAGNOSIS — I5032 Chronic diastolic (congestive) heart failure: Secondary | ICD-10-CM | POA: Diagnosis present

## 2020-03-29 DIAGNOSIS — E1169 Type 2 diabetes mellitus with other specified complication: Secondary | ICD-10-CM

## 2020-03-29 DIAGNOSIS — E1122 Type 2 diabetes mellitus with diabetic chronic kidney disease: Secondary | ICD-10-CM | POA: Diagnosis present

## 2020-03-29 DIAGNOSIS — Z006 Encounter for examination for normal comparison and control in clinical research program: Secondary | ICD-10-CM

## 2020-03-29 DIAGNOSIS — Z96612 Presence of left artificial shoulder joint: Secondary | ICD-10-CM | POA: Diagnosis present

## 2020-03-29 DIAGNOSIS — Z8616 Personal history of COVID-19: Secondary | ICD-10-CM

## 2020-03-29 DIAGNOSIS — W1830XA Fall on same level, unspecified, initial encounter: Secondary | ICD-10-CM | POA: Diagnosis present

## 2020-03-29 DIAGNOSIS — Z888 Allergy status to other drugs, medicaments and biological substances status: Secondary | ICD-10-CM | POA: Diagnosis not present

## 2020-03-29 DIAGNOSIS — Z87891 Personal history of nicotine dependence: Secondary | ICD-10-CM

## 2020-03-29 DIAGNOSIS — I455 Other specified heart block: Secondary | ICD-10-CM | POA: Diagnosis present

## 2020-03-29 DIAGNOSIS — Z7984 Long term (current) use of oral hypoglycemic drugs: Secondary | ICD-10-CM | POA: Diagnosis not present

## 2020-03-29 DIAGNOSIS — I10 Essential (primary) hypertension: Secondary | ICD-10-CM

## 2020-03-29 DIAGNOSIS — I2510 Atherosclerotic heart disease of native coronary artery without angina pectoris: Secondary | ICD-10-CM | POA: Diagnosis present

## 2020-03-29 DIAGNOSIS — E871 Hypo-osmolality and hyponatremia: Secondary | ICD-10-CM | POA: Diagnosis present

## 2020-03-29 DIAGNOSIS — R55 Syncope and collapse: Secondary | ICD-10-CM | POA: Diagnosis not present

## 2020-03-29 LAB — BASIC METABOLIC PANEL
Anion gap: 10 (ref 5–15)
BUN: 18 mg/dL (ref 8–23)
CO2: 26 mmol/L (ref 22–32)
Calcium: 9.2 mg/dL (ref 8.9–10.3)
Chloride: 99 mmol/L (ref 98–111)
Creatinine, Ser: 1.45 mg/dL — ABNORMAL HIGH (ref 0.61–1.24)
GFR, Estimated: 55 mL/min — ABNORMAL LOW (ref >60)
Glucose, Bld: 138 mg/dL — ABNORMAL HIGH (ref 70–99)
Potassium: 3.8 mmol/L (ref 3.5–5.1)
Sodium: 135 mmol/L (ref 135–145)

## 2020-03-29 LAB — CBC
HCT: 40.4 % (ref 39.0–52.0)
Hemoglobin: 14.1 g/dL (ref 13.0–17.0)
MCH: 32.6 pg (ref 26.0–34.0)
MCHC: 34.9 g/dL (ref 30.0–36.0)
MCV: 93.3 fL (ref 80.0–100.0)
Platelets: 196 10*3/uL (ref 150–400)
RBC: 4.33 MIL/uL (ref 4.22–5.81)
RDW: 12.6 % (ref 11.5–15.5)
WBC: 10 10*3/uL (ref 4.0–10.5)
nRBC: 0 % (ref 0.0–0.2)

## 2020-03-29 LAB — URINALYSIS, COMPLETE (UACMP) WITH MICROSCOPIC
Bacteria, UA: NONE SEEN
Bilirubin Urine: NEGATIVE
Glucose, UA: NEGATIVE mg/dL
Hgb urine dipstick: NEGATIVE
Ketones, ur: NEGATIVE mg/dL
Leukocytes,Ua: NEGATIVE
Nitrite: NEGATIVE
Protein, ur: NEGATIVE mg/dL
Specific Gravity, Urine: 1.005 (ref 1.005–1.030)
Squamous Epithelial / LPF: NONE SEEN (ref 0–5)
WBC, UA: NONE SEEN WBC/hpf (ref 0–5)
pH: 8 (ref 5.0–8.0)

## 2020-03-29 LAB — SODIUM, URINE, RANDOM: Sodium, Ur: 50 mmol/L

## 2020-03-29 LAB — TROPONIN I (HIGH SENSITIVITY)
Troponin I (High Sensitivity): 4 ng/L (ref <18)
Troponin I (High Sensitivity): 6 ng/L (ref <18)
Troponin I (High Sensitivity): 7 ng/L (ref <18)

## 2020-03-29 LAB — D-DIMER, QUANTITATIVE: D-Dimer, Quant: 1.42 ug/mL-FEU — ABNORMAL HIGH (ref 0.00–0.50)

## 2020-03-29 LAB — MAGNESIUM: Magnesium: 2.1 mg/dL (ref 1.7–2.4)

## 2020-03-29 LAB — TSH: TSH: 1.464 u[IU]/mL (ref 0.350–4.500)

## 2020-03-29 LAB — RESP PANEL BY RT-PCR (FLU A&B, COVID) ARPGX2
Influenza A by PCR: NEGATIVE
Influenza B by PCR: NEGATIVE
SARS Coronavirus 2 by RT PCR: NEGATIVE

## 2020-03-29 LAB — CREATININE, URINE, RANDOM: Creatinine, Urine: 67 mg/dL

## 2020-03-29 LAB — GLUCOSE, CAPILLARY: Glucose-Capillary: 132 mg/dL — ABNORMAL HIGH (ref 70–99)

## 2020-03-29 LAB — BRAIN NATRIURETIC PEPTIDE: B Natriuretic Peptide: 15 pg/mL (ref 0.0–100.0)

## 2020-03-29 MED ORDER — ACETAMINOPHEN 325 MG PO TABS
650.0000 mg | ORAL_TABLET | Freq: Four times a day (QID) | ORAL | Status: DC | PRN
  Administered 2020-03-29: 650 mg via ORAL
  Filled 2020-03-29: qty 2

## 2020-03-29 MED ORDER — SIMVASTATIN 20 MG PO TABS
40.0000 mg | ORAL_TABLET | Freq: Every day | ORAL | Status: DC
  Administered 2020-03-29 – 2020-04-01 (×4): 40 mg via ORAL
  Filled 2020-03-29 (×4): qty 2

## 2020-03-29 MED ORDER — IOHEXOL 350 MG/ML SOLN
75.0000 mL | Freq: Once | INTRAVENOUS | Status: AC | PRN
  Administered 2020-03-29: 75 mL via INTRAVENOUS

## 2020-03-29 MED ORDER — INSULIN ASPART 100 UNIT/ML ~~LOC~~ SOLN
0.0000 [IU] | Freq: Three times a day (TID) | SUBCUTANEOUS | Status: DC
  Administered 2020-03-30: 2 [IU] via SUBCUTANEOUS
  Administered 2020-03-31: 1 [IU] via SUBCUTANEOUS
  Filled 2020-03-29 (×2): qty 1

## 2020-03-29 MED ORDER — SODIUM CHLORIDE 0.9 % IV BOLUS
1000.0000 mL | Freq: Once | INTRAVENOUS | Status: AC
  Administered 2020-03-29: 1000 mL via INTRAVENOUS
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
## 2020-03-29 NOTE — ED Notes (Signed)
Pads placed on pt at this time

## 2020-03-29 NOTE — Plan of Care (Signed)
Pt admitted from ED, VSS and NAD observed. Pt with c/o headache on arrival. Administered Tylenol PO with some relief noted. Pt oriented to room and floor. Updated on POC and questions answered. Covid precautions removed once pt's Covid test resulted negative. Patient made aware, Special Airborne precautions discontinued. Bed in low position, call bell within reach. Bed alarm on to remind pt to call before needing to ambulate d/t high fall risk. Pt agreeable. Pt with call bell. Blood sugar checked see chart and assessment results.

## 2020-03-29 NOTE — ED Notes (Signed)
Pt taken to CT.

## 2020-03-29 NOTE — ED Notes (Signed)
Pt has returned from CT dept-- GCS 15. Bright redness noted to face; per reporting nurse this is patient baseline.  Pt denies dizziness- reports only ongoing HA.

## 2020-03-29 NOTE — ED Triage Notes (Signed)
Pt here via EMS [place] [employer] after he became dizzy and had a syncopal episode this afternoon after lunch, positive LOC, states "this has never happened before," hit the back left side of his head, no lac noted, no hematomas noted, pt states the fall was hard and knocked out hearing aides bilaterally. Only complaint now is a headache. Wife covid positive about 2 weeks ago, pt states his first test was negative, however he had "all the symptoms." Pt's face appears red, admits to borderline diabetes. Alert and oriented x4, denies blood thinners.

## 2020-03-29 NOTE — ED Notes (Signed)
Wife yelling from room stating she needs help. This RN and Arboriculturist entered room. Pt diaphoretic and pale. Wife states pt was flailing arms around and not responsive to her. Pt states he felt nauseated and then just remembers hearing wife screaming.  Pt states he is hurting on left under chest area. Pt states it was hurting some earlier but he didn't think anything of it. Pt able to answer questions. EKG performed and MD updated

## 2020-03-29 NOTE — ED Notes (Signed)
Late entry -- Pt to CT for CTA while dayshift giving report to this nurse.  Per reporting nurse pt had 2nd syncopal episode after arrival while in bed - wife at bedside.  Pt reportedly pale, diaphoretic at the time -- also report received that pt had 7 second pause on cardiac monitoring -- back to baseline per dayshift nurse.  Plan for admission -- bed assigned - pt to be transported upon confirmation that floor nurse is ready
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
## 2020-03-29 NOTE — ED Notes (Signed)
Pt became diaphoretic, states while in lobby in wheelchair he was going to pass out again, was placed in blue recliner chair to wait for triage. Pt tolerated well.

## 2020-03-29 NOTE — ED Notes (Signed)
Duplicate covid swab order noted-- pt  covid swab collected earlier at 1819hrs-- results pending

## 2020-03-30 ENCOUNTER — Inpatient Hospital Stay
Admit: 2020-03-30 | Discharge: 2020-03-30 | Disposition: A | Discharge status: Home / Self Care | Payer: Commercial Managed Care - HMO | Attending: Internal Medicine | Admitting: Internal Medicine

## 2020-03-30 DIAGNOSIS — E785 Hyperlipidemia, unspecified: Secondary | ICD-10-CM

## 2020-03-30 DIAGNOSIS — I10 Essential (primary) hypertension: Secondary | ICD-10-CM

## 2020-03-30 DIAGNOSIS — R7989 Other specified abnormal findings of blood chemistry: Secondary | ICD-10-CM

## 2020-03-30 DIAGNOSIS — N179 Acute kidney failure, unspecified: Secondary | ICD-10-CM

## 2020-03-30 DIAGNOSIS — I455 Other specified heart block: Secondary | ICD-10-CM | POA: Diagnosis present

## 2020-03-30 DIAGNOSIS — R55 Syncope and collapse: Secondary | ICD-10-CM

## 2020-03-30 DIAGNOSIS — I5032 Chronic diastolic (congestive) heart failure: Secondary | ICD-10-CM | POA: Diagnosis present

## 2020-03-30 DIAGNOSIS — E7800 Pure hypercholesterolemia, unspecified: Secondary | ICD-10-CM | POA: Diagnosis present

## 2020-03-30 DIAGNOSIS — E1169 Type 2 diabetes mellitus with other specified complication: Secondary | ICD-10-CM

## 2020-03-30 LAB — CBC
HCT: 38.7 % — ABNORMAL LOW (ref 39.0–52.0)
Hemoglobin: 13.5 g/dL (ref 13.0–17.0)
MCH: 32.6 pg (ref 26.0–34.0)
MCHC: 34.9 g/dL (ref 30.0–36.0)
MCV: 93.5 fL (ref 80.0–100.0)
Platelets: 175 10*3/uL (ref 150–400)
RBC: 4.14 MIL/uL — ABNORMAL LOW (ref 4.22–5.81)
RDW: 12.6 % (ref 11.5–15.5)
WBC: 7.9 10*3/uL (ref 4.0–10.5)
nRBC: 0 % (ref 0.0–0.2)

## 2020-03-30 LAB — BASIC METABOLIC PANEL
Anion gap: 11 (ref 5–15)
BUN: 15 mg/dL (ref 8–23)
CO2: 22 mmol/L (ref 22–32)
Calcium: 9.2 mg/dL (ref 8.9–10.3)
Chloride: 102 mmol/L (ref 98–111)
Creatinine, Ser: 0.86 mg/dL (ref 0.61–1.24)
GFR, Estimated: >60 mL/min (ref >60)
Glucose, Bld: 130 mg/dL — ABNORMAL HIGH (ref 70–99)
Potassium: 3.9 mmol/L (ref 3.5–5.1)
Sodium: 135 mmol/L (ref 135–145)

## 2020-03-30 LAB — GLUCOSE, CAPILLARY
Glucose-Capillary: 146 mg/dL — ABNORMAL HIGH (ref 70–99)
Glucose-Capillary: 227 mg/dL — ABNORMAL HIGH (ref 70–99)
Glucose-Capillary: 92 mg/dL (ref 70–99)

## 2020-03-30 LAB — MAGNESIUM: Magnesium: 2.2 mg/dL (ref 1.7–2.4)

## 2020-03-30 LAB — TROPONIN I (HIGH SENSITIVITY): Troponin I (High Sensitivity): 5 ng/L (ref <18)

## 2020-03-30 MED ORDER — ATROPINE SULFATE 1 MG/10ML IJ SOSY: 0.5000 mg | PREFILLED_SYRINGE | Freq: Once | INTRAMUSCULAR | Status: DC | PRN

## 2020-03-30 NOTE — Progress Notes (Signed)
Patient ID: Jacob Rose, male   DOB: [birth year], 60 y.o.   MRN: [id number] [facility] PROGRESS NOTE  Jacob Rose QMV:[id number] DOB: 01-10-1961 DOA: 03/29/2020 PCP: Adin Hector, MD  HPI/Subjective: Patient coming in with syncopal episode.  They gave him some IV fluids and they were about to send him home when he had another episode and had a long 6-second pause.  Patient was admitted to the hospital with sinus pauses and syncope.  Currently feeling okay.  Objective: Vitals:   03/30/20 1118 03/30/20 1538  BP: (!) 144/73 (!) 159/75  Pulse: 65 (!) 57  Resp: 18 19  Temp: 97.7 F (36.5 C) 97.9 F (36.6 C)  SpO2: 97% 100%    Intake/Output Summary (Last 24 hours) at 03/30/2020 1742 Last data filed at 03/30/2020 1533 Gross per 24 hour  Intake 360 ml  Output 3035 ml  Net -2675 ml   Filed Weights   03/29/20 1406 03/29/20 2025 03/29/20 2325  Weight: 99.8 kg 95.5 kg 96.6 kg    ROS: Review of Systems  Respiratory: Negative for cough and shortness of breath.   Cardiovascular: Negative for chest pain.  Gastrointestinal: Negative for abdominal pain, nausea and vomiting.   Exam: Physical Exam HENT:     Head: Normocephalic.     Mouth/Throat:     Pharynx: No oropharyngeal exudate.  Eyes:     General: Lids are normal.     Conjunctiva/sclera: Conjunctivae normal.     Pupils: Pupils are equal, round, and reactive to light.  Cardiovascular:     Rate and Rhythm: Normal rate and regular rhythm.     Heart sounds: Normal heart sounds, S1 normal and S2 normal.  Pulmonary:     Breath sounds: Normal breath sounds. No decreased breath sounds, wheezing, rhonchi or rales.  Abdominal:     Palpations: Abdomen is soft.     Tenderness: There is no abdominal tenderness.  Musculoskeletal:     Right lower leg: No swelling.     Left lower leg: No swelling.  Skin:    General: Skin is warm.     Findings: No rash.  Neurological:     Mental Status: He is alert and oriented to  person, place, and time.       Data Reviewed: Basic Metabolic Panel: Recent Labs  Lab 03/29/20 1425 03/30/20 0536  NA 135 135  K 3.8 3.9  CL 99 102  CO2 26 22  GLUCOSE 138* 130*  BUN 18 15  CREATININE 1.45* 0.86  CALCIUM 9.2 9.2  MG 2.1 2.2   Liver Function Tests: No results for input(s): AST, ALT, ALKPHOS, BILITOT, PROT, ALBUMIN in the last 168 hours. No results for input(s): LIPASE, AMYLASE in the last 168 hours. No results for input(s): AMMONIA in the last 168 hours. CBC: Recent Labs  Lab 03/29/20 1425 03/30/20 0536  WBC 10.0 7.9  HGB 14.1 13.5  HCT 40.4 38.7*  MCV 93.3 93.5  PLT 196 175     CBG: Recent Labs  Lab 03/29/20 2157 03/30/20 0720 03/30/20 1118 03/30/20 1617  GLUCAP 132* 146* 227* 92    Recent Results (from the past 240 hour(s))  Resp Panel by RT-PCR (Flu A&B, Covid) Nasopharyngeal Swab     Status: None   Collection Time: 03/29/20  6:19 PM   Specimen: Nasopharyngeal Swab; Nasopharyngeal(NP) swabs in vial transport medium  Result Value Ref Range Status   SARS Coronavirus 2 by RT PCR NEGATIVE NEGATIVE Final    Comment: (NOTE)  SARS-CoV-2 target nucleic acids are NOT DETECTED.  The SARS-CoV-2 RNA is generally detectable in upper respiratory specimens during the acute phase of infection. The lowest concentration of SARS-CoV-2 viral copies this assay can detect is 138 copies/mL. A negative result does not preclude SARS-Cov-2 infection and should not be used as the sole basis for treatment or other patient management decisions. A negative result may occur with  improper specimen collection/handling, submission of specimen other than nasopharyngeal swab, presence of viral mutation(s) within the areas targeted by this assay, and inadequate number of viral copies(<138 copies/mL). A negative result must be combined with clinical observations, patient history, and epidemiological information. The expected result is Negative.  Fact Sheet for  Patients:  EntrepreneurPulse.com.au  Fact Sheet for Healthcare Providers:  IncredibleEmployment.be  This test is no t yet approved or cleared by the [place] FDA and  has been authorized for detection and/or diagnosis of SARS-CoV-2 by FDA under an Emergency Use Authorization (EUA). This EUA will remain  in effect (meaning this test can be used) for the duration of the COVID-19 declaration under Section 564(b)(1) of the Act, 21 U.S.C.section 360bbb-3(b)(1), unless the authorization is terminated  or revoked sooner.       Influenza A by PCR NEGATIVE NEGATIVE Final   Influenza B by PCR NEGATIVE NEGATIVE Final    Comment: (NOTE) The Xpert Xpress SARS-CoV-2/FLU/RSV plus assay is intended as an aid in the diagnosis of influenza from Nasopharyngeal swab specimens and should not be used as a sole basis for treatment. Nasal washings and aspirates are unacceptable for Xpert Xpress SARS-CoV-2/FLU/RSV testing.  Fact Sheet for Patients: EntrepreneurPulse.com.au  Fact Sheet for Healthcare Providers: IncredibleEmployment.be  This test is not yet approved or cleared by the [place] FDA and has been authorized for detection and/or diagnosis of SARS-CoV-2 by FDA under an Emergency Use Authorization (EUA). This EUA will remain in effect (meaning this test can be used) for the duration of the COVID-19 declaration under Section 564(b)(1) of the Act, 21 U.S.C. section 360bbb-3(b)(1), unless the authorization is terminated or revoked.  Performed at [facility], [address], [place], [place] [address]      Studies: CT Head Wo Contrast  Result Date: 03/29/2020 CLINICAL DATA:  Head trauma, moderate/severe. Neck trauma, impaired range of motion. Additional history provided: Syncopal episode with head trauma. EXAM: CT HEAD WITHOUT CONTRAST CT CERVICAL SPINE WITHOUT CONTRAST TECHNIQUE: Multidetector CT  imaging of the head and cervical spine was performed following the standard protocol without intravenous contrast. Multiplanar CT image reconstructions of the cervical spine were also generated. COMPARISON:  Head CT 06/30/2013. cervical spine MRI 03/13/2010. FINDINGS: CT HEAD FINDINGS Brain: Cerebral volume is normal for age. There is no acute intracranial hemorrhage. No demarcated cortical infarct. No extra-axial fluid collection. No evidence of intracranial mass. No midline shift. Vascular: No hyperdense vessel.  Atherosclerotic calcifications Skull: Normal. Negative for fracture or focal lesion. Sinuses/Orbits: Visualized orbits show no acute finding. Trace bilateral ethmoid sinus mucosal thickening. CT CERVICAL SPINE FINDINGS Alignment: Reversal of the expected cervical lordosis. Trace C2-C3 and C3-C4 grade 1 anterolisthesis. Trace C5-C6 grade 1 retrolisthesis. Skull base and vertebrae: The basion-dental and atlanto-dental intervals are maintained.No evidence of acute fracture to the cervical spine. Soft tissues and spinal canal: No prevertebral fluid or swelling. No visible canal hematoma. Disc levels: Cervical spondylosis with multilevel disc space narrowing, disc bulges and uncovertebral hypertrophy. Disc space narrowing is moderate at C4-C5 and C5-C6 and moderate/advanced at C6-C7. Multilevel bony neural foraminal narrowing  greatest bilaterally at C4-C5, C5-C6 and C6-C7. Multilevel spinal canal stenosis. Most notably, disc bulges contribute to apparent moderate spinal canal stenosis at C4-C5 and C5-C6 Upper chest: No consolidation which lung apices small pneumothorax IMPRESSION: CT head: 1. No evidence of acute intracranial abnormality. 2. Minimal ethmoid sinus mucosal thickening. CT cervical spine: 1. No evidence of acute fracture to the cervical spine. 2. Nonspecific reversal of the expected cervical lordosis. 3. Trace C2-C3 and C3-C4 grade 1 anterolisthesis. 4. Cervical spondylosis as described and  greatest at C4-C5, C5-C6 and C6-C7. Electronically Signed   By: Kellie Simmering DO   On: 03/29/2020 16:22   CT Angio Chest PE W and/or Wo Contrast  Result Date: 03/29/2020 CLINICAL DATA:  Chest pain with positive D-dimer EXAM: CT ANGIOGRAPHY CHEST WITH CONTRAST TECHNIQUE: Multidetector CT imaging of the chest was performed using the standard protocol during bolus administration of intravenous contrast. Multiplanar CT image reconstructions and MIPs were obtained to evaluate the vascular anatomy. CONTRAST:  36mL OMNIPAQUE IOHEXOL 350 MG/ML SOLN COMPARISON:  None. FINDINGS: Cardiovascular: Contrast injection is sufficient to demonstrate satisfactory opacification of the pulmonary arteries to the segmental level. There is no pulmonary embolus or evidence of right heart strain. The size of the main pulmonary artery is normal. Heart size is normal, with no pericardial effusion. The course and caliber of the aorta are normal. There is no atherosclerotic calcification. No acute aortic syndrome. Mediastinum/Nodes: No mediastinal, hilar or axillary lymphadenopathy. Normal visualized thyroid. Thoracic esophageal course is normal. Lungs/Pleura: Airways are patent. No pleural effusion, lobar consolidation, pneumothorax or pulmonary infarction. Upper Abdomen: Contrast bolus timing is not optimized for evaluation of the abdominal organs. The visualized portions of the organs of the upper abdomen are normal. Musculoskeletal: No chest wall abnormality. No bony spinal canal stenosis. Review of the MIP images confirms the above findings. IMPRESSION: No pulmonary embolus or other acute thoracic abnormality. Electronically Signed   By: Ulyses Jarred M.D.   On: 03/29/2020 19:20   CT Cervical Spine Wo Contrast  Result Date: 03/29/2020 CLINICAL DATA:  Head trauma, moderate/severe. Neck trauma, impaired range of motion. Additional history provided: Syncopal episode with head trauma. EXAM: CT HEAD WITHOUT CONTRAST CT CERVICAL SPINE  WITHOUT CONTRAST TECHNIQUE: Multidetector CT imaging of the head and cervical spine was performed following the standard protocol without intravenous contrast. Multiplanar CT image reconstructions of the cervical spine were also generated. COMPARISON:  Head CT 06/30/2013. cervical spine MRI 03/13/2010. FINDINGS: CT HEAD FINDINGS Brain: Cerebral volume is normal for age. There is no acute intracranial hemorrhage. No demarcated cortical infarct. No extra-axial fluid collection. No evidence of intracranial mass. No midline shift. Vascular: No hyperdense vessel.  Atherosclerotic calcifications Skull: Normal. Negative for fracture or focal lesion. Sinuses/Orbits: Visualized orbits show no acute finding. Trace bilateral ethmoid sinus mucosal thickening. CT CERVICAL SPINE FINDINGS Alignment: Reversal of the expected cervical lordosis. Trace C2-C3 and C3-C4 grade 1 anterolisthesis. Trace C5-C6 grade 1 retrolisthesis. Skull base and vertebrae: The basion-dental and atlanto-dental intervals are maintained.No evidence of acute fracture to the cervical spine. Soft tissues and spinal canal: No prevertebral fluid or swelling. No visible canal hematoma. Disc levels: Cervical spondylosis with multilevel disc space narrowing, disc bulges and uncovertebral hypertrophy. Disc space narrowing is moderate at C4-C5 and C5-C6 and moderate/advanced at C6-C7. Multilevel bony neural foraminal narrowing greatest bilaterally at C4-C5, C5-C6 and C6-C7. Multilevel spinal canal stenosis. Most notably, disc bulges contribute to apparent moderate spinal canal stenosis at C4-C5 and C5-C6 Upper chest: No consolidation which lung  apices small pneumothorax IMPRESSION: CT head: 1. No evidence of acute intracranial abnormality. 2. Minimal ethmoid sinus mucosal thickening. CT cervical spine: 1. No evidence of acute fracture to the cervical spine. 2. Nonspecific reversal of the expected cervical lordosis. 3. Trace C2-C3 and C3-C4 grade 1 anterolisthesis. 4.  Cervical spondylosis as described and greatest at C4-C5, C5-C6 and C6-C7. Electronically Signed   By: Kellie Simmering DO   On: 03/29/2020 16:22    Scheduled Meds: . insulin aspart  0-6 Units Subcutaneous TID WC  . simvastatin  40 mg Oral QHS    Assessment/Plan:  1. Sinus pauses of 6-7 seconds, with syncope.  [facility]ology consultation.  Will need pacemaker on Monday.  Echocardiogram done but still pending. 2. Acute kidney injury.  Creatinine 1.45 on presentation down to 0.86 with IV fluids. 3. Elevated D-dimer.  CT scan of the chest negative for PE 4. Essential hypertension.  Hold lisinopril with acute kidney injury 5. Type 2 diabetes with hyperlipidemia on simvastatin.  Holding Metformin and on sliding scale insulin.  Add on hemoglobin A1c       Code Status:  Code Status History    Date Active Date Inactive Code Status Order ID Comments User Context   12/29/2017 1612 12/30/2017 1729 Full Code [id number]  Lovell Sheehan, MD Inpatient   Advance Care Planning Activity    Questions for Most Recent Historical Code Status (Order [id number])      Family Communication: Spoke with wife at bedside Disposition Plan: Status is: Inpatient  Dispo: The patient is from: Home              Anticipated d/c is to: Home              Anticipated d/c date is: 04/01/2020 in the evening or 04/02/2020.              Patient currently needs a pacemaker prior to disposition   Difficult to place patient.  No.  Time spent: 28 minutes  [place]
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
## 2020-03-30 NOTE — Progress Notes (Signed)
*  PRELIMINARY RESULTS* Echocardiogram 2D Echocardiogram has been performed.  Jacob Rose 03/30/2020, 3:28 PM

## 2020-03-31 LAB — ECHOCARDIOGRAM COMPLETE
AR max vel: 1.38 cm2
AV Peak grad: 9.7 mmHg
Ao pk vel: 1.56 m/s
Area-P 1/2: 4.21 cm2
Height: 66 in
S' Lateral: 3.84 cm
Weight: 3408 oz

## 2020-03-31 LAB — HEMOGLOBIN A1C
Hgb A1c MFr Bld: 6.6 % — ABNORMAL HIGH (ref 4.8–5.6)
Mean Plasma Glucose: 142.72 mg/dL

## 2020-03-31 LAB — GLUCOSE, CAPILLARY
Glucose-Capillary: 156 mg/dL — ABNORMAL HIGH (ref 70–99)
Glucose-Capillary: 158 mg/dL — ABNORMAL HIGH (ref 70–99)
Glucose-Capillary: 143 mg/dL — ABNORMAL HIGH (ref 70–99)
Glucose-Capillary: 117 mg/dL — ABNORMAL HIGH (ref 70–99)
Glucose-Capillary: 161 mg/dL — ABNORMAL HIGH (ref 70–99)

## 2020-03-31 MED ORDER — LISINOPRIL 10 MG PO TABS
10.0000 mg | ORAL_TABLET | Freq: Every day | ORAL | Status: DC
  Administered 2020-03-31: 10 mg via ORAL
  Filled 2020-03-31: qty 1

## 2020-03-31 MED ORDER — SODIUM CHLORIDE 0.45 % IV SOLN: INTRAVENOUS | Status: DC

## 2020-03-31 MED ORDER — ALLOPURINOL 300 MG PO TABS
300.0000 mg | ORAL_TABLET | Freq: Every day | ORAL | Status: DC
  Administered 2020-03-31 – 2020-04-01 (×2): 300 mg via ORAL
  Filled 2020-03-31 (×3): qty 1

## 2020-03-31 MED ORDER — CEFAZOLIN SODIUM-DEXTROSE 2-4 GM/100ML-% IV SOLN: 2.0000 g | INTRAVENOUS | Status: DC

## 2020-03-31 MED ORDER — SODIUM CHLORIDE 0.9 % IV SOLN: INTRAVENOUS | Status: DC

## 2020-03-31 NOTE — Progress Notes (Signed)
Patient ID: Jacob Rose, male   DOB: 1960-12-22, 60 y.o.   MRN: [id number] [facility] PROGRESS NOTE  AMIRE LEAZER AYT:[id number] DOB: 11-19-1960 DOA: 03/29/2020 PCP: Adin Hector, MD  HPI/Subjective: Patient felt well this morning when I saw him yesterday after I saw him.  He came in with syncopal episode and found to have long sinus pause.  Patient will be set up for pacemaker tomorrow.  Objective: Vitals:   03/31/20 0747 03/31/20 1126  BP: (!) 162/92 (!) 160/91  Pulse: 62 65  Resp: 19 18  Temp: 97.7 F (36.5 C) 97.7 F (36.5 C)  SpO2: 99% 100%    Intake/Output Summary (Last 24 hours) at 03/31/2020 1433 Last data filed at 03/31/2020 0747 Gross per 24 hour  Intake --  Output 1275 ml  Net -1275 ml   Filed Weights   03/29/20 2025 03/29/20 2325 03/31/20 0405  Weight: 95.5 kg 96.6 kg 95.5 kg    ROS: Review of Systems  Respiratory: Negative for shortness of breath.   Cardiovascular: Negative for chest pain.  Gastrointestinal: Negative for abdominal pain, nausea and vomiting.   Exam: Physical Exam HENT:     Head: Normocephalic.     Mouth/Throat:     Pharynx: No oropharyngeal exudate.  Eyes:     General: Lids are normal.     Conjunctiva/sclera: Conjunctivae normal.  Cardiovascular:     Rate and Rhythm: Normal rate and regular rhythm.     Heart sounds: Normal heart sounds, S1 normal and S2 normal.  Pulmonary:     Breath sounds: Normal breath sounds. No decreased breath sounds, wheezing, rhonchi or rales.  Abdominal:     Palpations: Abdomen is soft.     Tenderness: There is no abdominal tenderness.  Musculoskeletal:     Right lower leg: No swelling.     Left lower leg: No swelling.  Skin:    General: Skin is warm.     Findings: No rash.  Neurological:     Mental Status: He is alert and oriented to person, place, and time.       Data Reviewed: Basic Metabolic Panel: Recent Labs  Lab 03/29/20 1425 03/30/20 0536  NA 135 135  K 3.8 3.9   CL 99 102  CO2 26 22  GLUCOSE 138* 130*  BUN 18 15  CREATININE 1.45* 0.86  CALCIUM 9.2 9.2  MG 2.1 2.2   CBC: Recent Labs  Lab 03/29/20 1425 03/30/20 0536  WBC 10.0 7.9  HGB 14.1 13.5  HCT 40.4 38.7*  MCV 93.3 93.5  PLT 196 175   BNP (last 3 results) Recent Labs    03/29/20 1425  BNP 15.0    CBG: Recent Labs  Lab 03/30/20 1118 03/30/20 1617 03/30/20 2007 03/31/20 0746 03/31/20 1124  GLUCAP 227* 92 156* 158* 143*    Recent Results (from the past 240 hour(s))  Resp Panel by RT-PCR (Flu A&B, Covid) Nasopharyngeal Swab     Status: None   Collection Time: 03/29/20  6:19 PM   Specimen: Nasopharyngeal Swab; Nasopharyngeal(NP) swabs in vial transport medium  Result Value Ref Range Status   SARS Coronavirus 2 by RT PCR NEGATIVE NEGATIVE Final    Comment: (NOTE) SARS-CoV-2 target nucleic acids are NOT DETECTED.  The SARS-CoV-2 RNA is generally detectable in upper respiratory specimens during the acute phase of infection. The lowest concentration of SARS-CoV-2 viral copies this assay can detect is 138 copies/mL. A negative result does not preclude SARS-Cov-2 infection and should not  be used as the sole basis for treatment or other patient management decisions. A negative result may occur with  improper specimen collection/handling, submission of specimen other than nasopharyngeal swab, presence of viral mutation(s) within the areas targeted by this assay, and inadequate number of viral copies(<138 copies/mL). A negative result must be combined with clinical observations, patient history, and epidemiological information. The expected result is Negative.  Fact Sheet for Patients:  EntrepreneurPulse.com.au  Fact Sheet for Healthcare Providers:  IncredibleEmployment.be  This test is no t yet approved or cleared by the [place] FDA and  has been authorized for detection and/or diagnosis of SARS-CoV-2 by FDA under an  Emergency Use Authorization (EUA). This EUA will remain  in effect (meaning this test can be used) for the duration of the COVID-19 declaration under Section 564(b)(1) of the Act, 21 U.S.C.section 360bbb-3(b)(1), unless the authorization is terminated  or revoked sooner.       Influenza A by PCR NEGATIVE NEGATIVE Final   Influenza B by PCR NEGATIVE NEGATIVE Final    Comment: (NOTE) The Xpert Xpress SARS-CoV-2/FLU/RSV plus assay is intended as an aid in the diagnosis of influenza from Nasopharyngeal swab specimens and should not be used as a sole basis for treatment. Nasal washings and aspirates are unacceptable for Xpert Xpress SARS-CoV-2/FLU/RSV testing.  Fact Sheet for Patients: EntrepreneurPulse.com.au  Fact Sheet for Healthcare Providers: IncredibleEmployment.be  This test is not yet approved or cleared by the [place] FDA and has been authorized for detection and/or diagnosis of SARS-CoV-2 by FDA under an Emergency Use Authorization (EUA). This EUA will remain in effect (meaning this test can be used) for the duration of the COVID-19 declaration under Section 564(b)(1) of the Act, 21 U.S.C. section 360bbb-3(b)(1), unless the authorization is terminated or revoked.  Performed at [facility], [address], [place],  [address]      Studies: CT Head Wo Contrast  Result Date: 03/29/2020 CLINICAL DATA:  Head trauma, moderate/severe. Neck trauma, impaired range of motion. Additional history provided: Syncopal episode with head trauma. EXAM: CT HEAD WITHOUT CONTRAST CT CERVICAL SPINE WITHOUT CONTRAST TECHNIQUE: Multidetector CT imaging of the head and cervical spine was performed following the standard protocol without intravenous contrast. Multiplanar CT image reconstructions of the cervical spine were also generated. COMPARISON:  Head CT 06/30/2013. cervical spine MRI 03/13/2010. FINDINGS: CT HEAD FINDINGS Brain:  Cerebral volume is normal for age. There is no acute intracranial hemorrhage. No demarcated cortical infarct. No extra-axial fluid collection. No evidence of intracranial mass. No midline shift. Vascular: No hyperdense vessel.  Atherosclerotic calcifications Skull: Normal. Negative for fracture or focal lesion. Sinuses/Orbits: Visualized orbits show no acute finding. Trace bilateral ethmoid sinus mucosal thickening. CT CERVICAL SPINE FINDINGS Alignment: Reversal of the expected cervical lordosis. Trace C2-C3 and C3-C4 grade 1 anterolisthesis. Trace C5-C6 grade 1 retrolisthesis. Skull base and vertebrae: The basion-dental and atlanto-dental intervals are maintained.No evidence of acute fracture to the cervical spine. Soft tissues and spinal canal: No prevertebral fluid or swelling. No visible canal hematoma. Disc levels: Cervical spondylosis with multilevel disc space narrowing, disc bulges and uncovertebral hypertrophy. Disc space narrowing is moderate at C4-C5 and C5-C6 and moderate/advanced at C6-C7. Multilevel bony neural foraminal narrowing greatest bilaterally at C4-C5, C5-C6 and C6-C7. Multilevel spinal canal stenosis. Most notably, disc bulges contribute to apparent moderate spinal canal stenosis at C4-C5 and C5-C6 Upper chest: No consolidation which lung apices small pneumothorax IMPRESSION: CT head: 1. No evidence of acute intracranial abnormality. 2. Minimal ethmoid sinus  mucosal thickening. CT cervical spine: 1. No evidence of acute fracture to the cervical spine. 2. Nonspecific reversal of the expected cervical lordosis. 3. Trace C2-C3 and C3-C4 grade 1 anterolisthesis. 4. Cervical spondylosis as described and greatest at C4-C5, C5-C6 and C6-C7. Electronically Signed   By: Kellie Simmering DO   On: 03/29/2020 16:22   CT Angio Chest PE W and/or Wo Contrast  Result Date: 03/29/2020 CLINICAL DATA:  Chest pain with positive D-dimer EXAM: CT ANGIOGRAPHY CHEST WITH CONTRAST TECHNIQUE: Multidetector CT imaging  of the chest was performed using the standard protocol during bolus administration of intravenous contrast. Multiplanar CT image reconstructions and MIPs were obtained to evaluate the vascular anatomy. CONTRAST:  8mL OMNIPAQUE IOHEXOL 350 MG/ML SOLN COMPARISON:  None. FINDINGS: Cardiovascular: Contrast injection is sufficient to demonstrate satisfactory opacification of the pulmonary arteries to the segmental level. There is no pulmonary embolus or evidence of right heart strain. The size of the main pulmonary artery is normal. Heart size is normal, with no pericardial effusion. The course and caliber of the aorta are normal. There is no atherosclerotic calcification. No acute aortic syndrome. Mediastinum/Nodes: No mediastinal, hilar or axillary lymphadenopathy. Normal visualized thyroid. Thoracic esophageal course is normal. Lungs/Pleura: Airways are patent. No pleural effusion, lobar consolidation, pneumothorax or pulmonary infarction. Upper Abdomen: Contrast bolus timing is not optimized for evaluation of the abdominal organs. The visualized portions of the organs of the upper abdomen are normal. Musculoskeletal: No chest wall abnormality. No bony spinal canal stenosis. Review of the MIP images confirms the above findings. IMPRESSION: No pulmonary embolus or other acute thoracic abnormality. Electronically Signed   By: Ulyses Jarred M.D.   On: 03/29/2020 19:20   CT Cervical Spine Wo Contrast  Result Date: 03/29/2020 CLINICAL DATA:  Head trauma, moderate/severe. Neck trauma, impaired range of motion. Additional history provided: Syncopal episode with head trauma. EXAM: CT HEAD WITHOUT CONTRAST CT CERVICAL SPINE WITHOUT CONTRAST TECHNIQUE: Multidetector CT imaging of the head and cervical spine was performed following the standard protocol without intravenous contrast. Multiplanar CT image reconstructions of the cervical spine were also generated. COMPARISON:  Head CT 06/30/2013. cervical spine MRI 03/13/2010.  FINDINGS: CT HEAD FINDINGS Brain: Cerebral volume is normal for age. There is no acute intracranial hemorrhage. No demarcated cortical infarct. No extra-axial fluid collection. No evidence of intracranial mass. No midline shift. Vascular: No hyperdense vessel.  Atherosclerotic calcifications Skull: Normal. Negative for fracture or focal lesion. Sinuses/Orbits: Visualized orbits show no acute finding. Trace bilateral ethmoid sinus mucosal thickening. CT CERVICAL SPINE FINDINGS Alignment: Reversal of the expected cervical lordosis. Trace C2-C3 and C3-C4 grade 1 anterolisthesis. Trace C5-C6 grade 1 retrolisthesis. Skull base and vertebrae: The basion-dental and atlanto-dental intervals are maintained.No evidence of acute fracture to the cervical spine. Soft tissues and spinal canal: No prevertebral fluid or swelling. No visible canal hematoma. Disc levels: Cervical spondylosis with multilevel disc space narrowing, disc bulges and uncovertebral hypertrophy. Disc space narrowing is moderate at C4-C5 and C5-C6 and moderate/advanced at C6-C7. Multilevel bony neural foraminal narrowing greatest bilaterally at C4-C5, C5-C6 and C6-C7. Multilevel spinal canal stenosis. Most notably, disc bulges contribute to apparent moderate spinal canal stenosis at C4-C5 and C5-C6 Upper chest: No consolidation which lung apices small pneumothorax IMPRESSION: CT head: 1. No evidence of acute intracranial abnormality. 2. Minimal ethmoid sinus mucosal thickening. CT cervical spine: 1. No evidence of acute fracture to the cervical spine. 2. Nonspecific reversal of the expected cervical lordosis. 3. Trace C2-C3 and C3-C4 grade 1 anterolisthesis. 4.  Cervical spondylosis as described and greatest at C4-C5, C5-C6 and C6-C7. Electronically Signed   By: Kellie Simmering DO   On: 03/29/2020 16:22   ECHOCARDIOGRAM COMPLETE  Result Date: 03/31/2020    ECHOCARDIOGRAM REPORT   Patient Name:   LAURIER JASPERSON Date of Exam: 03/30/2020 Medical Rec #:   [id number]         Height:       66.0 in Accession #:    [id number]        Weight:       213.0 lb Date of Birth:  May 22, 1960        BSA:          2.054 m Patient Age:    51 years          BP:           159/87 mmHg Patient Gender: M                 HR:           61 bpm. Exam Location:  [facility] Procedure: 2D Echo, Cardiac Doppler and Color Doppler Indications:     Syncope 780.2 / R55  History:         Patient has no prior history of Echocardiogram examinations.                  Signs/Symptoms:Shortness of Breath; Risk Factors:Hypertension.  Sonographer:     Alyse Low Roar Referring Phys:  [id number] Rhetta Mura Diagnosing Phys: Serafina Royals MD IMPRESSIONS  1. Left ventricular ejection fraction, by estimation, is 55 to 60%. The left ventricle has normal function. The left ventricle has no regional wall motion abnormalities. Left ventricular diastolic parameters are consistent with Grade II diastolic dysfunction (pseudonormalization).  2. Right ventricular systolic function is normal. The right ventricular size is normal.  3. The mitral valve is normal in structure. Trivial mitral valve regurgitation.  4. The aortic valve is normal in structure. Aortic valve regurgitation is not visualized. FINDINGS  Left Ventricle: Left ventricular ejection fraction, by estimation, is 55 to 60%. The left ventricle has normal function. The left ventricle has no regional wall motion abnormalities. The left ventricular internal cavity size was normal in size. There is  no left ventricular hypertrophy. Left ventricular diastolic parameters are consistent with Grade II diastolic dysfunction (pseudonormalization). Right Ventricle: The right ventricular size is normal. No increase in right ventricular wall thickness. Right ventricular systolic function is normal. Left Atrium: Left atrial size was normal in size. Right Atrium: Right atrial size was normal in size. Pericardium: There is no evidence of pericardial effusion. Mitral Valve: The  mitral valve is normal in structure. Trivial mitral valve regurgitation. Tricuspid Valve: The tricuspid valve is normal in structure. Tricuspid valve regurgitation is trivial. Aortic Valve: The aortic valve is normal in structure. Aortic valve regurgitation is not visualized. Aortic valve peak gradient measures 9.7 mmHg. Pulmonic Valve: The pulmonic valve was normal in structure. Pulmonic valve regurgitation is not visualized. Aorta: The aortic root and ascending aorta are structurally normal, with no evidence of dilitation. IAS/Shunts: No atrial level shunt detected by color flow Doppler.  LEFT VENTRICLE PLAX 2D LVIDd:         4.88 cm  Diastology LVIDs:         3.84 cm  LV e' medial:    7.18 cm/s LV PW:         0.93 cm  LV E/e' medial:  11.7 LV IVS:  1.05 cm  LV e' lateral:   12.80 cm/s LVOT diam:     1.70 cm  LV E/e' lateral: 6.6 LVOT Area:     2.27 cm  RIGHT VENTRICLE RV Mid diam:    3.03 cm RV S prime:     13.80 cm/s TAPSE (M-mode): 2.5 cm LEFT ATRIUM             Index       RIGHT ATRIUM           Index LA diam:        3.80 cm 1.85 cm/m  RA Area:     13.80 cm LA Vol (A2C):   53.9 ml 26.24 ml/m RA Volume:   33.00 ml  16.07 ml/m LA Vol (A4C):   77.3 ml 37.63 ml/m LA Biplane Vol: 66.3 ml 32.28 ml/m  AORTIC VALVE                PULMONIC VALVE AV Area (Vmax): 1.38 cm    PV Vmax:        1.17 m/s AV Vmax:        156.00 cm/s PV Peak grad:   5.5 mmHg AV Peak Grad:   9.7 mmHg    RVOT Peak grad: 1 mmHg LVOT Vmax:      95.10 cm/s  AORTA Ao Root diam: 3.00 cm MITRAL VALVE MV Area (PHT): 4.21 cm    SHUNTS MV Decel Time: 180 msec    Systemic Diam: 1.70 cm MV E velocity: 84.00 cm/s MV A velocity: 81.40 cm/s MV E/A ratio:  1.03 MV A Prime:    10.8 cm/s Serafina Royals MD Electronically signed by Serafina Royals MD Signature Date/Time: 03/31/2020/5:52:31 AM    Final     Scheduled Meds: . insulin aspart  0-6 Units Subcutaneous TID WC  . simvastatin  40 mg Oral QHS    Assessment/Plan:  1. Sinus pauses of 6 to  7 seconds with syncope.  Will have pacemaker placement tomorrow we will keep n.p.o. after midnight.  Echocardiogram shows normal EF. 2. Acute kidney injury.  Creatinine 1.45 on presentation down to 0.86 with IV fluids. 3. Essential hypertension can restart lisinopril with blood pressure being elevated. 4. Type 2 diabetes mellitus with hyperlipidemia.  Continue simvastatin.  Holding Metformin prior to procedure.  Hemoglobin A1c still pending. 5. Elevated D-dimer CT scan of the chest negative for PE.     Code Status:  Code Status History    Date Active Date Inactive Code Status Order ID Comments User Context   12/29/2017 1612 12/30/2017 1729 Full Code [id number]  Lovell Sheehan, MD Inpatient   Advance Care Planning Activity    Questions for Most Recent Historical Code Status (Order [id number])      Family Communication: Spoke with wife yesterday about the plan Disposition Plan: Status is: Inpatient  Dispo: The patient is from: Home              Anticipated d/c is to: Home              Anticipated d/c date is: Potentially late on 04/01/2020 after pacemaker placement versus 04/02/2020.              Patient currently will require pacemaker placement prior to disposition   Difficult to place patient.  No  Consultants:  Cardiology  Time spent: 28 minutes  [place]

## 2020-03-31 NOTE — Progress Notes (Signed)
[facility] Card[facility] Encounter Note  Patient: Jacob Rose / Admit Date: 03/29/2020 / Date of Encounter: 03/31/2020, 6:10 AM   Subjective: 2/20.  Patient overall has felt well overnight with no evidence of chest pain shortness of breath or congestive heart failure type symptoms.  Telemetry has shown no evidence of sick sinus syndrome or heart block or pauses.  The patient did have a thickened syncopal episode with a long pause and sinus arrest 6 causing his admission to the hospital.  This happened twice yesterday although currently he has had no further issues.  We have discussed at length further treatment options including pacemaker placement.  Due to the likely very infrequent episodes of syncope and pauses with sinus arrest, he would most easily be treated with a Micra pacemaker.  This would provide backup pacing for the rare episodes that he has causing his syncope.  We have had a long discussion with the patient and the family and they are agreeable at this time.  They understand the risks and benefits of the pacemaker placement as listed and include risks including right femoral vein bleeding bruising and infection as well as hemopericardium other rhythm disturbances and side effects of medications.  He is at low risk for conscious sedation  Echocardiogram showing normal LV systolic function with ejection fraction of 55% and no evidence of significant valvular heart disease  Review of Systems: Positive for: None Negative for: Vision change, hearing change, syncope, dizziness, nausea, vomiting,diarrhea, bloody stool, stomach pain, cough, congestion, diaphoresis, urinary frequency, urinary pain,skin lesions, skin rashes Others previously listed  Objective: Telemetry: Normal sinus rhythm Physical Exam: Blood pressure 138/85, pulse (!) 55, temperature 98 F (36.7 C), temperature source Oral, resp. rate 17, height 5\' 6"  (1.676 m), weight 95.5 kg, SpO2 97 %. Body mass index is  33.99 kg/m. General: Well developed, well nourished, in no acute distress. Head: Normocephalic, atraumatic, sclera non-icteric, no xanthomas, nares are without discharge. Neck: No apparent masses Lungs: Normal respirations with no wheezes, no rhonchi, no rales , no crackles   Heart: Regular rate and rhythm, normal S1 S2, no murmur, no rub, no gallop, PMI is normal size and placement, carotid upstroke normal without bruit, jugular venous pressure normal Abdomen: Soft, non-tender, non-distended with normoactive bowel sounds. No hepatosplenomegaly. Abdominal aorta is normal size without bruit Extremities: No edema, no clubbing, no cyanosis, no ulcers,  Peripheral: 2+ radial, 2+ femoral, 2+ dorsal pedal pulses Neuro: Alert and oriented. Moves all extremities spontaneously. Psych:  Responds to questions appropriately with a normal affect.   Intake/Output Summary (Last 24 hours) at 03/31/2020 0610 Last data filed at 03/31/2020 0549 Gross per 24 hour  Intake --  Output 2835 ml  Net -2835 ml    Inpatient Medications:  . insulin aspart  0-6 Units Subcutaneous TID WC  . simvastatin  40 mg Oral QHS   Infusions:   Labs: Recent Labs    03/29/20 1425 03/30/20 0536  NA 135 135  K 3.8 3.9  CL 99 102  CO2 26 22  GLUCOSE 138* 130*  BUN 18 15  CREATININE 1.45* 0.86  CALCIUM 9.2 9.2  MG 2.1 2.2   No results for input(s): AST, ALT, ALKPHOS, BILITOT, PROT, ALBUMIN in the last 72 hours. Recent Labs    03/29/20 1425 03/30/20 0536  WBC 10.0 7.9  HGB 14.1 13.5  HCT 40.4 38.7*  MCV 93.3 93.5  PLT 196 175   No results for input(s): CKTOTAL, CKMB, TROPONINI in the last 72  hours. Invalid input(s): POCBNP No results for input(s): HGBA1C in the last 72 hours.   Weights: Filed Weights   03/29/20 2025 03/29/20 2325 03/31/20 0405  Weight: 95.5 kg 96.6 kg 95.5 kg     Radiology/Studies:  CT Head Wo Contrast  Result Date: 03/29/2020 CLINICAL DATA:  Head trauma, moderate/severe. Neck  trauma, impaired range of motion. Additional history provided: Syncopal episode with head trauma. EXAM: CT HEAD WITHOUT CONTRAST CT CERVICAL SPINE WITHOUT CONTRAST TECHNIQUE: Multidetector CT imaging of the head and cervical spine was performed following the standard protocol without intravenous contrast. Multiplanar CT image reconstructions of the cervical spine were also generated. COMPARISON:  Head CT 06/30/2013. cervical spine MRI 03/13/2010. FINDINGS: CT HEAD FINDINGS Brain: Cerebral volume is normal for age. There is no acute intracranial hemorrhage. No demarcated cortical infarct. No extra-axial fluid collection. No evidence of intracranial mass. No midline shift. Vascular: No hyperdense vessel.  Atherosclerotic calcifications Skull: Normal. Negative for fracture or focal lesion. Sinuses/Orbits: Visualized orbits show no acute finding. Trace bilateral ethmoid sinus mucosal thickening. CT CERVICAL SPINE FINDINGS Alignment: Reversal of the expected cervical lordosis. Trace C2-C3 and C3-C4 grade 1 anterolisthesis. Trace C5-C6 grade 1 retrolisthesis. Skull base and vertebrae: The basion-dental and atlanto-dental intervals are maintained.No evidence of acute fracture to the cervical spine. Soft tissues and spinal canal: No prevertebral fluid or swelling. No visible canal hematoma. Disc levels: Cervical spondylosis with multilevel disc space narrowing, disc bulges and uncovertebral hypertrophy. Disc space narrowing is moderate at C4-C5 and C5-C6 and moderate/advanced at C6-C7. Multilevel bony neural foraminal narrowing greatest bilaterally at C4-C5, C5-C6 and C6-C7. Multilevel spinal canal stenosis. Most notably, disc bulges contribute to apparent moderate spinal canal stenosis at C4-C5 and C5-C6 Upper chest: No consolidation which lung apices small pneumothorax IMPRESSION: CT head: 1. No evidence of acute intracranial abnormality. 2. Minimal ethmoid sinus mucosal thickening. CT cervical spine: 1. No evidence of  acute fracture to the cervical spine. 2. Nonspecific reversal of the expected cervical lordosis. 3. Trace C2-C3 and C3-C4 grade 1 anterolisthesis. 4. Cervical spondylosis as described and greatest at C4-C5, C5-C6 and C6-C7. Electronically Signed   By: Kellie Simmering DO   On: 03/29/2020 16:22   CT Angio Chest PE W and/or Wo Contrast  Result Date: 03/29/2020 CLINICAL DATA:  Chest pain with positive D-dimer EXAM: CT ANGIOGRAPHY CHEST WITH CONTRAST TECHNIQUE: Multidetector CT imaging of the chest was performed using the standard protocol during bolus administration of intravenous contrast. Multiplanar CT image reconstructions and MIPs were obtained to evaluate the vascular anatomy. CONTRAST:  60mL OMNIPAQUE IOHEXOL 350 MG/ML SOLN COMPARISON:  None. FINDINGS: Cardiovascular: Contrast injection is sufficient to demonstrate satisfactory opacification of the pulmonary arteries to the segmental level. There is no pulmonary embolus or evidence of right heart strain. The size of the main pulmonary artery is normal. Heart size is normal, with no pericardial effusion. The course and caliber of the aorta are normal. There is no atherosclerotic calcification. No acute aortic syndrome. Mediastinum/Nodes: No mediastinal, hilar or axillary lymphadenopathy. Normal visualized thyroid. Thoracic esophageal course is normal. Lungs/Pleura: Airways are patent. No pleural effusion, lobar consolidation, pneumothorax or pulmonary infarction. Upper Abdomen: Contrast bolus timing is not optimized for evaluation of the abdominal organs. The visualized portions of the organs of the upper abdomen are normal. Musculoskeletal: No chest wall abnormality. No bony spinal canal stenosis. Review of the MIP images confirms the above findings. IMPRESSION: No pulmonary embolus or other acute thoracic abnormality. Electronically Signed   By: Ulyses Jarred  M.D.   On: 03/29/2020 19:20   CT Cervical Spine Wo Contrast  Result Date: 03/29/2020 CLINICAL DATA:   Head trauma, moderate/severe. Neck trauma, impaired range of motion. Additional history provided: Syncopal episode with head trauma. EXAM: CT HEAD WITHOUT CONTRAST CT CERVICAL SPINE WITHOUT CONTRAST TECHNIQUE: Multidetector CT imaging of the head and cervical spine was performed following the standard protocol without intravenous contrast. Multiplanar CT image reconstructions of the cervical spine were also generated. COMPARISON:  Head CT 06/30/2013. cervical spine MRI 03/13/2010. FINDINGS: CT HEAD FINDINGS Brain: Cerebral volume is normal for age. There is no acute intracranial hemorrhage. No demarcated cortical infarct. No extra-axial fluid collection. No evidence of intracranial mass. No midline shift. Vascular: No hyperdense vessel.  Atherosclerotic calcifications Skull: Normal. Negative for fracture or focal lesion. Sinuses/Orbits: Visualized orbits show no acute finding. Trace bilateral ethmoid sinus mucosal thickening. CT CERVICAL SPINE FINDINGS Alignment: Reversal of the expected cervical lordosis. Trace C2-C3 and C3-C4 grade 1 anterolisthesis. Trace C5-C6 grade 1 retrolisthesis. Skull base and vertebrae: The basion-dental and atlanto-dental intervals are maintained.No evidence of acute fracture to the cervical spine. Soft tissues and spinal canal: No prevertebral fluid or swelling. No visible canal hematoma. Disc levels: Cervical spondylosis with multilevel disc space narrowing, disc bulges and uncovertebral hypertrophy. Disc space narrowing is moderate at C4-C5 and C5-C6 and moderate/advanced at C6-C7. Multilevel bony neural foraminal narrowing greatest bilaterally at C4-C5, C5-C6 and C6-C7. Multilevel spinal canal stenosis. Most notably, disc bulges contribute to apparent moderate spinal canal stenosis at C4-C5 and C5-C6 Upper chest: No consolidation which lung apices small pneumothorax IMPRESSION: CT head: 1. No evidence of acute intracranial abnormality. 2. Minimal ethmoid sinus mucosal thickening. CT  cervical spine: 1. No evidence of acute fracture to the cervical spine. 2. Nonspecific reversal of the expected cervical lordosis. 3. Trace C2-C3 and C3-C4 grade 1 anterolisthesis. 4. Cervical spondylosis as described and greatest at C4-C5, C5-C6 and C6-C7. Electronically Signed   By: Kellie Simmering DO   On: 03/29/2020 16:22   ECHOCARDIOGRAM COMPLETE  Result Date: 03/31/2020    ECHOCARDIOGRAM REPORT   Patient Name:   Jacob Rose Date of Exam: 03/30/2020 Medical Rec #:  [id number]         Height:       66.0 in Accession #:    [id number]        Weight:       213.0 lb Date of Birth:  [birth year]        BSA:          2.054 m Patient Age:    60 years          BP:           159/87 mmHg Patient Gender: M                 HR:           61 bpm. Exam Location:  [facility] Procedure: 2D Echo, Cardiac Doppler and Color Doppler Indications:     Syncope 780.2 / R55  History:         Patient has no prior history of Echocardiogram examinations.                  Signs/Symptoms:Shortness of Breath; Risk Factors:Hypertension.  Sonographer:     Alyse Low Roar Referring Phys:  [id number] Rhetta Mura Diagnosing Phys: Serafina Royals MD IMPRESSIONS  1. Left ventricular ejection fraction, by estimation, is 55 to 60%. The left ventricle has normal function. The left  ventricle has no regional wall motion abnormalities. Left ventricular diastolic parameters are consistent with Grade II diastolic dysfunction (pseudonormalization).  2. Right ventricular systolic function is normal. The right ventricular size is normal.  3. The mitral valve is normal in structure. Trivial mitral valve regurgitation.  4. The aortic valve is normal in structure. Aortic valve regurgitation is not visualized. FINDINGS  Left Ventricle: Left ventricular ejection fraction, by estimation, is 55 to 60%. The left ventricle has normal function. The left ventricle has no regional wall motion abnormalities. The left ventricular internal cavity size was normal in size. There  is  no left ventricular hypertrophy. Left ventricular diastolic parameters are consistent with Grade II diastolic dysfunction (pseudonormalization). Right Ventricle: The right ventricular size is normal. No increase in right ventricular wall thickness. Right ventricular systolic function is normal. Left Atrium: Left atrial size was normal in size. Right Atrium: Right atrial size was normal in size. Pericardium: There is no evidence of pericardial effusion. Mitral Valve: The mitral valve is normal in structure. Trivial mitral valve regurgitation. Tricuspid Valve: The tricuspid valve is normal in structure. Tricuspid valve regurgitation is trivial. Aortic Valve: The aortic valve is normal in structure. Aortic valve regurgitation is not visualized. Aortic valve peak gradient measures 9.7 mmHg. Pulmonic Valve: The pulmonic valve was normal in structure. Pulmonic valve regurgitation is not visualized. Aorta: The aortic root and ascending aorta are structurally normal, with no evidence of dilitation. IAS/Shunts: No atrial level shunt detected by color flow Doppler.  LEFT VENTRICLE PLAX 2D LVIDd:         4.88 cm  Diastology LVIDs:         3.84 cm  LV e' medial:    7.18 cm/s LV PW:         0.93 cm  LV E/e' medial:  11.7 LV IVS:        1.05 cm  LV e' lateral:   12.80 cm/s LVOT diam:     1.70 cm  LV E/e' lateral: 6.6 LVOT Area:     2.27 cm  RIGHT VENTRICLE RV Mid diam:    3.03 cm RV S prime:     13.80 cm/s TAPSE (M-mode): 2.5 cm LEFT ATRIUM             Index       RIGHT ATRIUM           Index LA diam:        3.80 cm 1.85 cm/m  RA Area:     13.80 cm LA Vol (A2C):   53.9 ml 26.24 ml/m RA Volume:   33.00 ml  16.07 ml/m LA Vol (A4C):   77.3 ml 37.63 ml/m LA Biplane Vol: 66.3 ml 32.28 ml/m  AORTIC VALVE                PULMONIC VALVE AV Area (Vmax): 1.38 cm    PV Vmax:        1.17 m/s AV Vmax:        156.00 cm/s PV Peak grad:   5.5 mmHg AV Peak Grad:   9.7 mmHg    RVOT Peak grad: 1 mmHg LVOT Vmax:      95.10 cm/s  AORTA Ao  Root diam: 3.00 cm MITRAL VALVE MV Area (PHT): 4.21 cm    SHUNTS MV Decel Time: 180 msec    Systemic Diam: 1.70 cm MV E velocity: 84.00 cm/s MV A velocity: 81.40 cm/s MV E/A ratio:  1.03 MV A Prime:    10.8  cm/s Serafina Royals MD Electronically signed by Serafina Royals MD Signature Date/Time: 03/31/2020/5:52:31 AM    Final      Assessment and Recommendation  60 y.o. male with known hypertension diabetes hyperlipidemia chronic kidney disease stage III with a cardiac catheterization in the past showing normal coronary arteries with an event of syncope needing further treatment including pacemaker placement 1.  Continue monitoring patient for any rhythm disturbances 2.  Continue treatment risk factor modification including diabetes hypertension hyperlipidemia 3.  Micra pacemaker placement tomorrow for further treatment of sinus pauses which.  To likely be rare and therefore best be served by leadless pacemaker placement as per above which will likely occur tomorrow  Signed, Serafina Royals M.D. FACC

## 2020-04-01 ENCOUNTER — Encounter: Payer: Self-pay | Admitting: Cardiology

## 2020-04-01 ENCOUNTER — Encounter
Admission: EM | Disposition: A | Discharge status: Home / Self Care | Payer: Self-pay | Source: Home / Self Care | Attending: Internal Medicine

## 2020-04-01 HISTORY — PX: PACEMAKER LEADLESS INSERTION: EP1219

## 2020-04-01 LAB — GLUCOSE, CAPILLARY
Glucose-Capillary: 129 mg/dL — ABNORMAL HIGH (ref 70–99)
Glucose-Capillary: 121 mg/dL — ABNORMAL HIGH (ref 70–99)
Glucose-Capillary: 117 mg/dL — ABNORMAL HIGH (ref 70–99)
Glucose-Capillary: 143 mg/dL — ABNORMAL HIGH (ref 70–99)
Glucose-Capillary: 177 mg/dL — ABNORMAL HIGH (ref 70–99)

## 2020-04-01 LAB — CBC
HCT: 42.1 % (ref 39.0–52.0)
Hemoglobin: 15.4 g/dL (ref 13.0–17.0)
MCH: 33.5 pg (ref 26.0–34.0)
MCHC: 36.6 g/dL — ABNORMAL HIGH (ref 30.0–36.0)
MCV: 91.5 fL (ref 80.0–100.0)
Platelets: 222 10*3/uL (ref 150–400)
RBC: 4.6 MIL/uL (ref 4.22–5.81)
RDW: 12.4 % (ref 11.5–15.5)
WBC: 11.7 10*3/uL — ABNORMAL HIGH (ref 4.0–10.5)
nRBC: 0 % (ref 0.0–0.2)

## 2020-04-01 LAB — BASIC METABOLIC PANEL
Anion gap: 8 (ref 5–15)
BUN: 15 mg/dL (ref 8–23)
CO2: 23 mmol/L (ref 22–32)
Calcium: 9.5 mg/dL (ref 8.9–10.3)
Chloride: 101 mmol/L (ref 98–111)
Creatinine, Ser: 0.86 mg/dL (ref 0.61–1.24)
GFR, Estimated: >60 mL/min (ref >60)
Glucose, Bld: 142 mg/dL — ABNORMAL HIGH (ref 70–99)
Potassium: 4.2 mmol/L (ref 3.5–5.1)
Sodium: 132 mmol/L — ABNORMAL LOW (ref 135–145)

## 2020-04-01 LAB — TROPONIN I (HIGH SENSITIVITY)
Troponin I (High Sensitivity): 203 ng/L (ref <18)
Troponin I (High Sensitivity): 147 ng/L (ref <18)

## 2020-04-01 SURGERY — PACEMAKER LEADLESS INSERTION
Anesthesia: Moderate Sedation

## 2020-04-01 MED ORDER — FENTANYL CITRATE (PF) 100 MCG/2ML IJ SOLN
INTRAMUSCULAR | Status: AC
  Filled 2020-04-01: qty 2

## 2020-04-01 MED ORDER — HEPARIN SODIUM (PORCINE) 1000 UNIT/ML IJ SOLN
INTRAMUSCULAR | Status: DC | PRN
  Administered 2020-04-01: 4500 [IU] via INTRAVENOUS

## 2020-04-01 MED ORDER — FENTANYL CITRATE (PF) 100 MCG/2ML IJ SOLN
INTRAMUSCULAR | Status: DC | PRN
  Administered 2020-04-01 (×2): 25 ug via INTRAVENOUS

## 2020-04-01 MED ORDER — MIDAZOLAM HCL 2 MG/2ML IJ SOLN
INTRAMUSCULAR | Status: AC
  Filled 2020-04-01: qty 2

## 2020-04-01 MED ORDER — MIDAZOLAM HCL 2 MG/2ML IJ SOLN
INTRAMUSCULAR | Status: DC | PRN
  Administered 2020-04-01 (×2): 1 mg via INTRAVENOUS

## 2020-04-01 MED ORDER — SODIUM CHLORIDE 0.9 % IV SOLN: 250.0000 mL | INTRAVENOUS | Status: DC | PRN

## 2020-04-01 MED ORDER — SODIUM CHLORIDE 0.9% FLUSH
3.0000 mL | Freq: Two times a day (BID) | INTRAVENOUS | Status: DC
  Administered 2020-04-01: 3 mL via INTRAVENOUS

## 2020-04-01 MED ORDER — CEFAZOLIN SODIUM-DEXTROSE 2-4 GM/100ML-% IV SOLN
INTRAVENOUS | Status: AC
  Filled 2020-04-01: qty 100

## 2020-04-01 MED ORDER — SODIUM CHLORIDE 0.9% FLUSH: 3.0000 mL | INTRAVENOUS | Status: DC | PRN

## 2020-04-01 MED ORDER — ONDANSETRON HCL 4 MG/2ML IJ SOLN: 4.0000 mg | Freq: Four times a day (QID) | INTRAMUSCULAR | Status: DC | PRN

## 2020-04-01 MED ORDER — HEPARIN (PORCINE) IN NACL 1000-0.9 UT/500ML-% IV SOLN
INTRAVENOUS | Status: AC
  Filled 2020-04-01: qty 1000

## 2020-04-01 MED ORDER — LIDOCAINE HCL (PF) 1 % IJ SOLN
INTRAMUSCULAR | Status: DC | PRN
  Administered 2020-04-01: 15 mL

## 2020-04-01 MED ORDER — LIDOCAINE HCL (PF) 1 % IJ SOLN
INTRAMUSCULAR | Status: AC
  Filled 2020-04-01: qty 30

## 2020-04-01 MED ORDER — ACETAMINOPHEN 325 MG PO TABS: 650.0000 mg | ORAL_TABLET | ORAL | Status: DC | PRN

## 2020-04-01 MED ORDER — HEPARIN SODIUM (PORCINE) 1000 UNIT/ML IJ SOLN
INTRAMUSCULAR | Status: AC
  Filled 2020-04-01: qty 1

## 2020-04-01 MED ORDER — HEPARIN (PORCINE) IN NACL 1000-0.9 UT/500ML-% IV SOLN
INTRAVENOUS | Status: DC | PRN
  Administered 2020-04-01 (×2): 500 mL

## 2020-04-01 MED ORDER — IOHEXOL 300 MG/ML  SOLN
INTRAMUSCULAR | Status: DC | PRN
  Administered 2020-04-01: 10 mL

## 2020-04-01 MED ORDER — SODIUM CHLORIDE 0.9 % IV BOLUS
250.0000 mL | Freq: Once | INTRAVENOUS | Status: AC
  Administered 2020-04-01: 250 mL via INTRAVENOUS

## 2020-04-01 SURGICAL SUPPLY — 13 items
DILATOR VESSEL 38 20CM 12FR (INTRODUCER) ×2 IMPLANT
DILATOR VESSEL 38 20CM 14FR (INTRODUCER) ×2 IMPLANT
DILATOR VESSEL 38 20CM 18FR (INTRODUCER) ×2 IMPLANT
DILATOR VESSEL 38 20CM 8FR (INTRODUCER) ×2 IMPLANT
MICRA AV TRANSCATH PACING SYS (Pacemaker) ×2 IMPLANT
MICRA INTRODUCER SHEATH (SHEATH) ×2
NEEDLE PERC 18GX7CM (NEEDLE) ×2 IMPLANT
PACK CARDIAC CATH (CUSTOM PROCEDURE TRAY) ×2 IMPLANT
SHEATH AVANTI 7FRX11 (SHEATH) ×2 IMPLANT
SHEATH INTRODUCER MICRA (SHEATH) ×1 IMPLANT
SYSTEM PACING TRNSCTH AV MICRA (Pacemaker) ×1 IMPLANT
WIRE AMPLATZ SS-J .035X180CM (WIRE) ×2 IMPLANT
WIRE GUIDERIGHT .035X150 (WIRE) ×2 IMPLANT

## 2020-04-01 NOTE — Progress Notes (Signed)
Patient ID: Jacob Rose, male   DOB: 05-Sep-1960, 60 y.o.   MRN: [id number] [facility] PROGRESS NOTE  Jacob Rose OEV:[id number] DOB: [birth year] DOA: 03/29/2020 PCP: Adin Hector, MD  HPI/Subjective: Patient felt well this morning prior to pacemaker placement.  No recurrence of syncope or any episodes.  Patient states he does have some shortness of breath.  Objective: Vitals:   04/01/20 1222 04/01/20 1555  BP: 126/80 104/66  Pulse: 61 69  Resp: 18 20  Temp: 98 F (36.7 C) 98.5 F (36.9 C)  SpO2: 100% 98%    Intake/Output Summary (Last 24 hours) at 04/01/2020 1645 Last data filed at 04/01/2020 8299 Gross per 24 hour  Intake 39.56 ml  Output 400 ml  Net -360.44 ml   Filed Weights   03/31/20 0405 04/01/20 0407 04/01/20 0850  Weight: 95.5 kg 94.5 kg 94.3 kg    ROS: Review of Systems  Respiratory: Positive for shortness of breath. Negative for cough.   Cardiovascular: Negative for chest pain.  Gastrointestinal: Negative for abdominal pain, nausea and vomiting.   Exam: Physical Exam HENT:     Head: Normocephalic.     Mouth/Throat:     Pharynx: No oropharyngeal exudate.  Eyes:     General: Lids are normal.     Conjunctiva/sclera: Conjunctivae normal.  Cardiovascular:     Rate and Rhythm: Normal rate and regular rhythm.     Heart sounds: Normal heart sounds, S1 normal and S2 normal.  Pulmonary:     Breath sounds: Examination of the right-lower field reveals decreased breath sounds. Examination of the left-lower field reveals decreased breath sounds. Decreased breath sounds present. No wheezing, rhonchi or rales.  Abdominal:     Palpations: Abdomen is soft.     Tenderness: There is no abdominal tenderness.  Musculoskeletal:     Right lower leg: No swelling.     Left lower leg: No swelling.  Skin:    General: Skin is warm.     Findings: No rash.  Neurological:     Mental Status: He is alert and oriented to person, place, and time.        Data Reviewed: Basic Metabolic Panel: Recent Labs  Lab 03/29/20 1425 03/30/20 0536 04/01/20 0502  NA 135 135 132*  K 3.8 3.9 4.2  CL 99 102 101  CO2 26 22 23   GLUCOSE 138* 130* 142*  BUN 18 15 15   CREATININE 1.45* 0.86 0.86  CALCIUM 9.2 9.2 9.5  MG 2.1 2.2  --    CBC: Recent Labs  Lab 03/29/20 1425 03/30/20 0536 04/01/20 0502  WBC 10.0 7.9 11.7*  HGB 14.1 13.5 15.4  HCT 40.4 38.7* 42.1  MCV 93.3 93.5 91.5  PLT 196 175 222  BNP (last 3 results) Recent Labs    03/29/20 1425  BNP 15.0     CBG: Recent Labs  Lab 03/31/20 1646 03/31/20 2020 04/01/20 0757 04/01/20 0844 04/01/20 1216  GLUCAP 117* 161* 129* 121* 117*    Recent Results (from the past 240 hour(s))  Resp Panel by RT-PCR (Flu A&B, Covid) Nasopharyngeal Swab     Status: None   Collection Time: 03/29/20  6:19 PM   Specimen: Nasopharyngeal Swab; Nasopharyngeal(NP) swabs in vial transport medium  Result Value Ref Range Status   SARS Coronavirus 2 by RT PCR NEGATIVE NEGATIVE Final    Comment: (NOTE) SARS-CoV-2 target nucleic acids are NOT DETECTED.  The SARS-CoV-2 RNA is generally detectable in upper respiratory specimens during the acute  phase of infection. The lowest concentration of SARS-CoV-2 viral copies this assay can detect is 138 copies/mL. A negative result does not preclude SARS-Cov-2 infection and should not be used as the sole basis for treatment or other patient management decisions. A negative result may occur with  improper specimen collection/handling, submission of specimen other than nasopharyngeal swab, presence of viral mutation(s) within the areas targeted by this assay, and inadequate number of viral copies(<138 copies/mL). A negative result must be combined with clinical observations, patient history, and epidemiological information. The expected result is Negative.  Fact Sheet for Patients:  EntrepreneurPulse.com.au  Fact Sheet for Healthcare  Providers:  IncredibleEmployment.be  This test is no t yet approved or cleared by the [place] FDA and  has been authorized for detection and/or diagnosis of SARS-CoV-2 by FDA under an Emergency Use Authorization (EUA). This EUA will remain  in effect (meaning this test can be used) for the duration of the COVID-19 declaration under Section 564(b)(1) of the Act, 21 U.S.C.section 360bbb-3(b)(1), unless the authorization is terminated  or revoked sooner.       Influenza A by PCR NEGATIVE NEGATIVE Final   Influenza B by PCR NEGATIVE NEGATIVE Final    Comment: (NOTE) The Xpert Xpress SARS-CoV-2/FLU/RSV plus assay is intended as an aid in the diagnosis of influenza from Nasopharyngeal swab specimens and should not be used as a sole basis for treatment. Nasal washings and aspirates are unacceptable for Xpert Xpress SARS-CoV-2/FLU/RSV testing.  Fact Sheet for Patients: EntrepreneurPulse.com.au  Fact Sheet for Healthcare Providers: IncredibleEmployment.be  This test is not yet approved or cleared by the [place] FDA and has been authorized for detection and/or diagnosis of SARS-CoV-2 by FDA under an Emergency Use Authorization (EUA). This EUA will remain in effect (meaning this test can be used) for the duration of the COVID-19 declaration under Section 564(b)(1) of the Act, 21 U.S.C. section 360bbb-3(b)(1), unless the authorization is terminated or revoked.  Performed at [facility], [address] [place],  [address]      Studies: EP PPM/ICD IMPLANT  Result Date: 04/01/2020 Successful Micra AV leadless pacemaker implantation   Scheduled Meds: . allopurinol  300 mg Oral QHS  . insulin aspart  0-6 Units Subcutaneous TID WC  . simvastatin  40 mg Oral QHS  . sodium chloride flush  3 mL Intravenous Q12H   Continuous Infusions: . sodium chloride    . ceFAZolin       Assessment/Plan:  1. Sinus pauses of 6 to 7 seconds with syncope.  Patient had pacemaker placement today.  Anticipate discharge tomorrow.  Echocardiogram shows normal EF. 2. Acute kidney injury with a creatinine of 1.45 on presentation down to 0.86 3. Essential hypertension blood pressure went high yesterday and I restarted lisinopril.  Blood pressure low this morning and I held lisinopril 4. Type 2 diabetes mellitus with hyperlipidemia on simvastatin.  Holding Metformin.  Hemoglobin A1c good at 6.6. 5. Elevated D-dimer.  CT scan negative for PE.        Code Status:     Code Status Orders  (From admission, onward)         Start     Ordered   04/01/20 1158  Full code  Continuous        04/01/20 1157        Code Status History    Date Active Date Inactive Code Status Order ID Comments User Context   12/29/2017 1612 12/30/2017 1729 Full Code [id number]  Kurtis Bushman  R, MD Inpatient   Advance Care Planning Activity     Family Communication: Wife at the bedside Disposition Plan: Status is: Inpatient  Dispo: The patient is from: Home              Anticipated d/c is to: Home              Anticipated d/c date is: 04/02/2020              Patient currently medically stable after pacemaker placement today.  Cardiology wanted to watch overnight.   Difficult to place patient.  No  Time spent: 27 minutes  [place]

## 2020-04-01 NOTE — Progress Notes (Signed)
Patient for pacemaker placement. Report called and given to [facility]. Patient without complaints at this time. Wife at bedside.

## 2020-04-01 NOTE — Progress Notes (Signed)
Patient returned to room s/p pacemaker placement via R groin. Dressing C/D/I. Patient with no complaints of pain.  On bedrest until 1445. Wife at bedside.

## 2020-04-01 NOTE — Progress Notes (Deleted)
Patient with high anxiety and anxious about going home tomorrow. Educated multiple times to slow breathing down and breathe in through his nose and out his mouth.  When this nurse rounds on patient, his oxygen is often out of his nose.  Patient getting breathing treatment and c/o pain going down L arm. Dr. Leslye Peer made aware. Will obtain troponin and EKG

## 2020-04-01 NOTE — Progress Notes (Signed)
[facility] Cardiology [facility] Encounter Note  Patient: Jacob Rose / Admit Date: 03/29/2020 / Date of Encounter: 04/01/2020, 8:19 AM   Subjective: 2/20.  Patient overall has felt well overnight with no evidence of chest pain shortness of breath or congestive heart failure type symptoms.  Telemetry has shown no evidence of sick sinus syndrome or heart block or pauses.  The patient did have a thickened syncopal episode with a long pause and sinus arrest 6 causing his admission to the hospital.  This happened twice yesterday although currently he has had no further issues.  We have discussed at length further treatment options including pacemaker placement.  Due to the likely very infrequent episodes of syncope and pauses with sinus arrest, he would most easily be treated with a Micra pacemaker.  This would provide backup pacing for the rare episodes that he has causing his syncope.  We have had a long discussion with the patient and the family and they are agreeable at this time.  They understand the risks and benefits of the pacemaker placement as listed and include risks including right femoral vein bleeding bruising and infection as well as hemopericardium other rhythm disturbances and side effects of medications.  He is at low risk for conscious sedation  2/21.No acute changes in condition overnight.  Patient has not had any cardiovascular symptoms and/or syncope.  Telemetry shows normal sinus rhythm.  We have discussed at length again with the wife and the patient about relatively isolated episode of pauses causing syncope and the best treatment would be the [facility] pacemaker for backup only.  They understand the risks and benefits of this treatment option  Echocardiogram showing normal LV systolic function with ejection fraction of 55% and no evidence of significant valvular heart disease  Review of Systems: Positive for: None Negative for: Vision change, hearing change, syncope, dizziness,  nausea, vomiting,diarrhea, bloody stool, stomach pain, cough, congestion, diaphoresis, urinary frequency, urinary pain,skin lesions, skin rashes Others previously listed  Objective: Telemetry: Normal sinus rhythm Physical Exam: Blood pressure 107/67, pulse 61, temperature 98.3 F (36.8 C), resp. rate 18, height 5\' 6"  (1.676 m), weight 94.5 kg, SpO2 97 %. Body mass index is 33.64 kg/m. General: Well developed, well nourished, in no acute distress. Head: Normocephalic, atraumatic, sclera non-icteric, no xanthomas, nares are without discharge. Neck: No apparent masses Lungs: Normal respirations with no wheezes, no rhonchi, no rales , no crackles   Heart: Regular rate and rhythm, normal S1 S2, no murmur, no rub, no gallop, PMI is normal size and placement, carotid upstroke normal without bruit, jugular venous pressure normal Abdomen: Soft, non-tender, non-distended with normoactive bowel sounds. No hepatosplenomegaly. Abdominal aorta is normal size without bruit Extremities: No edema, no clubbing, no cyanosis, no ulcers,  Peripheral: 2+ radial, 2+ femoral, 2+ dorsal pedal pulses Neuro: Alert and oriented. Moves all extremities spontaneously. Psych:  Responds to questions appropriately with a normal affect.   Intake/Output Summary (Last 24 hours) at 04/01/2020 0819 Last data filed at 04/01/2020 0643 Gross per 24 hour  Intake 39.56 ml  Output 1200 ml  Net -1160.44 ml    Inpatient Medications:  . allopurinol  300 mg Oral QHS  . insulin aspart  0-6 Units Subcutaneous TID WC  . simvastatin  40 mg Oral QHS   Infusions:  . sodium chloride 20 mL/hr at 04/01/20 0643  . sodium chloride 20 mL/hr at 04/01/20 0643  . ceFAZolin    . [START ON 04/03/2020]  ceFAZolin (ANCEF) IV  Labs: Recent Labs    03/29/20 1425 03/30/20 0536 04/01/20 0502  NA 135 135 132*  K 3.8 3.9 4.2  CL 99 102 101  CO2 26 22 23   GLUCOSE 138* 130* 142*  BUN 18 15 15   CREATININE 1.45* 0.86 0.86  CALCIUM 9.2 9.2  9.5  MG 2.1 2.2  --    No results for input(s): AST, ALT, ALKPHOS, BILITOT, PROT, ALBUMIN in the last 72 hours. Recent Labs    03/30/20 0536 04/01/20 0502  WBC 7.9 11.7*  HGB 13.5 15.4  HCT 38.7* 42.1  MCV 93.5 91.5  PLT 175 222   No results for input(s): CKTOTAL, CKMB, TROPONINI in the last 72 hours. Invalid input(s): POCBNP Recent Labs    03/30/20 0536  HGBA1C 6.6*     Weights: Filed Weights   03/29/20 2325 03/31/20 0405 04/01/20 0407  Weight: 96.6 kg 95.5 kg 94.5 kg     Radiology/Studies:  CT Head Wo Contrast  Result Date: 03/29/2020 CLINICAL DATA:  Head trauma, moderate/severe. Neck trauma, impaired range of motion. Additional history provided: Syncopal episode with head trauma. EXAM: CT HEAD WITHOUT CONTRAST CT CERVICAL SPINE WITHOUT CONTRAST TECHNIQUE: Multidetector CT imaging of the head and cervical spine was performed following the standard protocol without intravenous contrast. Multiplanar CT image reconstructions of the cervical spine were also generated. COMPARISON:  Head CT 06/30/2013. cervical spine MRI 03/13/2010. FINDINGS: CT HEAD FINDINGS Brain: Cerebral volume is normal for age. There is no acute intracranial hemorrhage. No demarcated cortical infarct. No extra-axial fluid collection. No evidence of intracranial mass. No midline shift. Vascular: No hyperdense vessel.  Atherosclerotic calcifications Skull: Normal. Negative for fracture or focal lesion. Sinuses/Orbits: Visualized orbits show no acute finding. Trace bilateral ethmoid sinus mucosal thickening. CT CERVICAL SPINE FINDINGS Alignment: Reversal of the expected cervical lordosis. Trace C2-C3 and C3-C4 grade 1 anterolisthesis. Trace C5-C6 grade 1 retrolisthesis. Skull base and vertebrae: The basion-dental and atlanto-dental intervals are maintained.No evidence of acute fracture to the cervical spine. Soft tissues and spinal canal: No prevertebral fluid or swelling. No visible canal hematoma. Disc levels:  Cervical spondylosis with multilevel disc space narrowing, disc bulges and uncovertebral hypertrophy. Disc space narrowing is moderate at C4-C5 and C5-C6 and moderate/advanced at C6-C7. Multilevel bony neural foraminal narrowing greatest bilaterally at C4-C5, C5-C6 and C6-C7. Multilevel spinal canal stenosis. Most notably, disc bulges contribute to apparent moderate spinal canal stenosis at C4-C5 and C5-C6 Upper chest: No consolidation which lung apices small pneumothorax IMPRESSION: CT head: 1. No evidence of acute intracranial abnormality. 2. Minimal ethmoid sinus mucosal thickening. CT cervical spine: 1. No evidence of acute fracture to the cervical spine. 2. Nonspecific reversal of the expected cervical lordosis. 3. Trace C2-C3 and C3-C4 grade 1 anterolisthesis. 4. Cervical spondylosis as described and greatest at C4-C5, C5-C6 and C6-C7. Electronically Signed   By: Kellie Simmering DO   On: 03/29/2020 16:22   CT Angio Chest PE W and/or Wo Contrast  Result Date: 03/29/2020 CLINICAL DATA:  Chest pain with positive D-dimer EXAM: CT ANGIOGRAPHY CHEST WITH CONTRAST TECHNIQUE: Multidetector CT imaging of the chest was performed using the standard protocol during bolus administration of intravenous contrast. Multiplanar CT image reconstructions and MIPs were obtained to evaluate the vascular anatomy. CONTRAST:  20mL OMNIPAQUE IOHEXOL 350 MG/ML SOLN COMPARISON:  None. FINDINGS: Cardiovascular: Contrast injection is sufficient to demonstrate satisfactory opacification of the pulmonary arteries to the segmental level. There is no pulmonary embolus or evidence of right heart strain. The size of the main  pulmonary artery is normal. Heart size is normal, with no pericardial effusion. The course and caliber of the aorta are normal. There is no atherosclerotic calcification. No acute aortic syndrome. Mediastinum/Nodes: No mediastinal, hilar or axillary lymphadenopathy. Normal visualized thyroid. Thoracic esophageal course is  normal. Lungs/Pleura: Airways are patent. No pleural effusion, lobar consolidation, pneumothorax or pulmonary infarction. Upper Abdomen: Contrast bolus timing is not optimized for evaluation of the abdominal organs. The visualized portions of the organs of the upper abdomen are normal. Musculoskeletal: No chest wall abnormality. No bony spinal canal stenosis. Review of the MIP images confirms the above findings. IMPRESSION: No pulmonary embolus or other acute thoracic abnormality. Electronically Signed   By: Ulyses Jarred M.D.   On: 03/29/2020 19:20   CT Cervical Spine Wo Contrast  Result Date: 03/29/2020 CLINICAL DATA:  Head trauma, moderate/severe. Neck trauma, impaired range of motion. Additional history provided: Syncopal episode with head trauma. EXAM: CT HEAD WITHOUT CONTRAST CT CERVICAL SPINE WITHOUT CONTRAST TECHNIQUE: Multidetector CT imaging of the head and cervical spine was performed following the standard protocol without intravenous contrast. Multiplanar CT image reconstructions of the cervical spine were also generated. COMPARISON:  Head CT 06/30/2013. cervical spine MRI 03/13/2010. FINDINGS: CT HEAD FINDINGS Brain: Cerebral volume is normal for age. There is no acute intracranial hemorrhage. No demarcated cortical infarct. No extra-axial fluid collection. No evidence of intracranial mass. No midline shift. Vascular: No hyperdense vessel.  Atherosclerotic calcifications Skull: Normal. Negative for fracture or focal lesion. Sinuses/Orbits: Visualized orbits show no acute finding. Trace bilateral ethmoid sinus mucosal thickening. CT CERVICAL SPINE FINDINGS Alignment: Reversal of the expected cervical lordosis. Trace C2-C3 and C3-C4 grade 1 anterolisthesis. Trace C5-C6 grade 1 retrolisthesis. Skull base and vertebrae: The basion-dental and atlanto-dental intervals are maintained.No evidence of acute fracture to the cervical spine. Soft tissues and spinal canal: No prevertebral fluid or swelling. No  visible canal hematoma. Disc levels: Cervical spondylosis with multilevel disc space narrowing, disc bulges and uncovertebral hypertrophy. Disc space narrowing is moderate at C4-C5 and C5-C6 and moderate/advanced at C6-C7. Multilevel bony neural foraminal narrowing greatest bilaterally at C4-C5, C5-C6 and C6-C7. Multilevel spinal canal stenosis. Most notably, disc bulges contribute to apparent moderate spinal canal stenosis at C4-C5 and C5-C6 Upper chest: No consolidation which lung apices small pneumothorax IMPRESSION: CT head: 1. No evidence of acute intracranial abnormality. 2. Minimal ethmoid sinus mucosal thickening. CT cervical spine: 1. No evidence of acute fracture to the cervical spine. 2. Nonspecific reversal of the expected cervical lordosis. 3. Trace C2-C3 and C3-C4 grade 1 anterolisthesis. 4. Cervical spondylosis as described and greatest at C4-C5, C5-C6 and C6-C7. Electronically Signed   By: Kellie Simmering DO   On: 03/29/2020 16:22   ECHOCARDIOGRAM COMPLETE  Result Date: 03/31/2020    ECHOCARDIOGRAM REPORT   Patient Name:   Jacob Rose Date of Exam: 03/30/2020 Medical Rec #:  [id number]         Height:       66.0 in Accession #:    [id number]        Weight:       213.0 lb Date of Birth:  [birth year]        BSA:          2.054 m Patient Age:    60 years          BP:           159/87 mmHg Patient Gender: M  HR:           61 bpm. Exam Location:  [facility] Procedure: 2D Echo, Cardiac Doppler and Color Doppler Indications:     Syncope 780.2 / R55  History:         Patient has no prior history of Echocardiogram examinations.                  Signs/Symptoms:Shortness of Breath; Risk Factors:Hypertension.  Sonographer:     Alyse Low Roar Referring Phys:  [id number] Rhetta Mura Diagnosing Phys: Serafina Royals MD IMPRESSIONS  1. Left ventricular ejection fraction, by estimation, is 55 to 60%. The left ventricle has normal function. The left ventricle has no regional wall motion abnormalities.  Left ventricular diastolic parameters are consistent with Grade II diastolic dysfunction (pseudonormalization).  2. Right ventricular systolic function is normal. The right ventricular size is normal.  3. The mitral valve is normal in structure. Trivial mitral valve regurgitation.  4. The aortic valve is normal in structure. Aortic valve regurgitation is not visualized. FINDINGS  Left Ventricle: Left ventricular ejection fraction, by estimation, is 55 to 60%. The left ventricle has normal function. The left ventricle has no regional wall motion abnormalities. The left ventricular internal cavity size was normal in size. There is  no left ventricular hypertrophy. Left ventricular diastolic parameters are consistent with Grade II diastolic dysfunction (pseudonormalization). Right Ventricle: The right ventricular size is normal. No increase in right ventricular wall thickness. Right ventricular systolic function is normal. Left Atrium: Left atrial size was normal in size. Right Atrium: Right atrial size was normal in size. Pericardium: There is no evidence of pericardial effusion. Mitral Valve: The mitral valve is normal in structure. Trivial mitral valve regurgitation. Tricuspid Valve: The tricuspid valve is normal in structure. Tricuspid valve regurgitation is trivial. Aortic Valve: The aortic valve is normal in structure. Aortic valve regurgitation is not visualized. Aortic valve peak gradient measures 9.7 mmHg. Pulmonic Valve: The pulmonic valve was normal in structure. Pulmonic valve regurgitation is not visualized. Aorta: The aortic root and ascending aorta are structurally normal, with no evidence of dilitation. IAS/Shunts: No atrial level shunt detected by color flow Doppler.  LEFT VENTRICLE PLAX 2D LVIDd:         4.88 cm  Diastology LVIDs:         3.84 cm  LV e' medial:    7.18 cm/s LV PW:         0.93 cm  LV E/e' medial:  11.7 LV IVS:        1.05 cm  LV e' lateral:   12.80 cm/s LVOT diam:     1.70 cm  LV E/e'  lateral: 6.6 LVOT Area:     2.27 cm  RIGHT VENTRICLE RV Mid diam:    3.03 cm RV S prime:     13.80 cm/s TAPSE (M-mode): 2.5 cm LEFT ATRIUM             Index       RIGHT ATRIUM           Index LA diam:        3.80 cm 1.85 cm/m  RA Area:     13.80 cm LA Vol (A2C):   53.9 ml 26.24 ml/m RA Volume:   33.00 ml  16.07 ml/m LA Vol (A4C):   77.3 ml 37.63 ml/m LA Biplane Vol: 66.3 ml 32.28 ml/m  AORTIC VALVE  PULMONIC VALVE AV Area (Vmax): 1.38 cm    PV Vmax:        1.17 m/s AV Vmax:        156.00 cm/s PV Peak grad:   5.5 mmHg AV Peak Grad:   9.7 mmHg    RVOT Peak grad: 1 mmHg LVOT Vmax:      95.10 cm/s  AORTA Ao Root diam: 3.00 cm MITRAL VALVE MV Area (PHT): 4.21 cm    SHUNTS MV Decel Time: 180 msec    Systemic Diam: 1.70 cm MV E velocity: 84.00 cm/s MV A velocity: 81.40 cm/s MV E/A ratio:  1.03 MV A Prime:    10.8 cm/s Serafina Royals MD Electronically signed by Serafina Royals MD Signature Date/Time: 03/31/2020/5:52:31 AM    Final      Assessment and Recommendation  60 y.o. male with known hypertension diabetes hyperlipidemia chronic kidney disease stage III with a cardiac catheterization in the past showing normal coronary arteries with an event of syncope needing further treatment including pacemaker placement 1.  Continue monitoring patient for any rhythm disturbances 2.  Continue treatment risk factor modification including diabetes hypertension hyperlipidemia 3.  Micra pacemaker placement today for further treatment of sinus pauses which.  To likely be rare and therefore best be served by leadless pacemaker placement as per above which will likely occur today  Signed, Serafina Royals M.D. FACC

## 2020-04-02 LAB — BASIC METABOLIC PANEL
Anion gap: 8 (ref 5–15)
BUN: 15 mg/dL (ref 8–23)
CO2: 24 mmol/L (ref 22–32)
Calcium: 9.2 mg/dL (ref 8.9–10.3)
Chloride: 98 mmol/L (ref 98–111)
Creatinine, Ser: 0.85 mg/dL (ref 0.61–1.24)
GFR, Estimated: >60 mL/min (ref >60)
Glucose, Bld: 129 mg/dL — ABNORMAL HIGH (ref 70–99)
Potassium: 4.4 mmol/L (ref 3.5–5.1)
Sodium: 130 mmol/L — ABNORMAL LOW (ref 135–145)

## 2020-04-02 LAB — GLUCOSE, CAPILLARY: Glucose-Capillary: 143 mg/dL — ABNORMAL HIGH (ref 70–99)

## 2020-04-02 LAB — CALCIUM, IONIZED
Calcium, Ionized, Serum: 4.9 mg/dL (ref 4.5–5.6)
Calcium, Ionized, Serum: 4.9 mg/dL (ref 4.5–5.6)

## 2020-04-02 MED ORDER — LISINOPRIL 10 MG PO TABS: 5.0000 mg | ORAL_TABLET | Freq: Every day | ORAL | Status: AC

## 2020-04-02 MED ORDER — ASPIRIN EC 81 MG PO TBEC: 81.0000 mg | DELAYED_RELEASE_TABLET | Freq: Every day | ORAL | Status: DC

## 2020-04-02 NOTE — Discharge Summary (Signed)
[facility] [place] NAME: Jacob Rose    MR#:  [id number]  DATE OF BIRTH:  December 13, 1960  DATE OF ADMISSION:  03/29/2020 ADMITTING PHYSICIAN: Rhetta Mura, DO  DATE OF DISCHARGE: 04/02/2020 10:53 AM  PRIMARY CARE PHYSICIAN: Adin Hector, MD    ADMISSION DIAGNOSIS:  Syncope [R55] Sinus pause [I45.5] Syncope, unspecified syncope type [R55]  DISCHARGE DIAGNOSIS:  Principal Problem:   Syncope Active Problems:   Sinus pause   Chronic diastolic CHF (congestive heart failure) (HCC)   AKI (acute kidney injury) ([place])   Hypercholesteremia   Essential hypertension   Type 2 diabetes mellitus with hyperlipidemia ([place])   Positive D dimer   SECONDARY DIAGNOSIS:   Past Medical History:  Diagnosis Date  . Arthritis    osteoarthritis  . Coronary artery disease   . Gout    recent outbreak 12/2017. taking prednisone briefly to treat along with allopurinol  . Hypercholesteremia   . Hypertension   . Low testosterone in male   . Shortness of breath dyspnea 12/2017   DOE  . Sleep apnea    does not use cpap, had previous surgery to fix sleep apnea    HOSPITAL COURSE:   1.  Sinus pauses of 6 to 7 seconds with syncope.  Patient had a pacemaker placed yesterday.  Cardiology saw the patient this morning and remove sutures in the right groin.  Patient feeling well.  No further episodes of syncope during the hospital course. 2.  Acute kidney injury with creatinine of 1.45 on presentation.  Came down to 0.85 with IV fluid hydration 3.  Essential hypertension.  Blood pressure variable during the hospital course.  Can go back on lisinopril half dose 5 mg daily 4.  Type 2 diabetes mellitus with hyperlipidemia.  Continue simvastatin.  Hold Metformin for 2 days since he did have procedure with dye. 5.  Elevated D-dimer.  CT scan of the chest negative for PE. 6.  Hyponatremia.  Recommend follow-up as outpatient.  Sodium 130 upon  disposition.  Of note the nursing staff called me about chest pain on this patient the night before discharge but this patient did not have any chest pain or shortness of breath and I ordered troponins that were slightly elevated but the patient had a procedure with a pacemaker placement in his heart which would explain the elevation of troponin.  The patient did not have any symptoms last night.Marland Kitchen  DISCHARGE CONDITIONS:   satisfactory  CONSULTS OBTAINED:  Cardiology  DRUG ALLERGIES:   Allergies  Allergen Reactions  . Flexeril [Cyclobenzaprine] Other (See Comments)    Bad dreams    DISCHARGE MEDICATIONS:   Allergies as of 04/02/2020      Reactions   Flexeril [cyclobenzaprine] Other (See Comments)   Bad dreams      Medication List    STOP taking these medications   metFORMIN 500 MG 24 hr tablet Commonly known as: GLUCOPHAGE-XR     TAKE these medications   allopurinol 300 MG tablet Commonly known as: ZYLOPRIM Take 300 mg by mouth at bedtime.   lisinopril 10 MG tablet Commonly known as: ZESTRIL Take 0.5 tablets (5 mg total) by mouth at bedtime. What changed: how much to take   simvastatin 40 MG tablet Commonly known as: ZOCOR Take 40 mg by mouth at bedtime.        DISCHARGE INSTRUCTIONS:   Follow up PMD 5 days Follow up Cardiolgy 1-2 weeks  If you  experience worsening of your admission symptoms, develop shortness of breath, life threatening emergency, suicidal or homicidal thoughts you must seek medical attention immediately by calling 911 or calling your MD immediately  if symptoms less severe.  You Must read complete instructions/literature along with all the possible adverse reactions/side effects for all the Medicines you take and that have been prescribed to you. Take any new Medicines after you have completely understood and accept all the possible adverse reactions/side effects.   Please note  You were cared for by a hospitalist during your hospital  stay. If you have any questions about your discharge medications or the care you received while you were in the hospital after you are discharged, you can call the unit and asked to speak with the hospitalist on call if the hospitalist that took care of you is not available. Once you are discharged, your primary care physician will handle any further medical issues. Please note that NO REFILLS for any discharge medications will be authorized once you are discharged, as it is imperative that you return to your primary care physician (or establish a relationship with a primary care physician if you do not have one) for your aftercare needs so that they can reassess your need for medications and monitor your lab values.    Today   CHIEF COMPLAINT:   Chief Complaint  Patient presents with  . Loss of Consciousness    HISTORY OF PRESENT ILLNESS:  Jacob Rose  is a 60 y.o. male came in with syncope   VITAL SIGNS:  Blood pressure 123/82, pulse 61, temperature 98 F (36.7 C), temperature source Oral, resp. rate 16, height 5\' 6"  (1.676 m), weight 95.7 kg, SpO2 96 %.  I/O:    Intake/Output Summary (Last 24 hours) at 04/02/2020 1921 Last data filed at 04/02/2020 0945 Gross per 24 hour  Intake 240 ml  Output -  Net 240 ml    PHYSICAL EXAMINATION:  GENERAL:  60 y.o.-year-old patient lying in the bed with no acute distress.  EYES: Pupils equal, round, reactive to light and accommodation. No scleral icterus. Extraocular muscles intact.  HEENT: Head atraumatic, normocephalic. Oropharynx and nasopharynx clear.  NECK:  Supple, no jugular venous distention. No thyroid enlargement, no tenderness.  LUNGS: Normal breath sounds bilaterally, no wheezing, rales,rhonchi or crepitation. No use of accessory muscles of respiration.  CARDIOVASCULAR: S1, S2 normal. No murmurs, rubs, or gallops.  ABDOMEN: Soft, non-tender, non-distended. Bowel sounds present. No organomegaly or mass.  EXTREMITIES: No pedal  edema, cyanosis, or clubbing.  NEUROLOGIC: Cranial nerves II through XII are intact. Muscle strength 5/5 in all extremities. Sensation intact. Gait not checked.  PSYCHIATRIC: The patient is alert and oriented x 3.  SKIN: No obvious rash, lesion, or ulcer.   DATA REVIEW:   CBC Recent Labs  Lab 04/01/20 0502  WBC 11.7*  HGB 15.4  HCT 42.1  PLT 222    Chemistries  Recent Labs  Lab 03/30/20 0536 04/01/20 0502 04/02/20 0439  NA 135   < > 130*  K 3.9   < > 4.4  CL 102   < > 98  CO2 22   < > 24  GLUCOSE 130*   < > 129*  BUN 15   < > 15  CREATININE 0.86   < > 0.85  CALCIUM 9.2   < > 9.2  MG 2.2  --   --    < > = values in this interval not displayed.  Microbiology Results  Results for orders placed or performed during the hospital encounter of 03/29/20  Resp Panel by RT-PCR (Flu A&B, Covid) Nasopharyngeal Swab     Status: None   Collection Time: 03/29/20  6:19 PM   Specimen: Nasopharyngeal Swab; Nasopharyngeal(NP) swabs in vial transport medium  Result Value Ref Range Status   SARS Coronavirus 2 by RT PCR NEGATIVE NEGATIVE Final    Comment: (NOTE) SARS-CoV-2 target nucleic acids are NOT DETECTED.  The SARS-CoV-2 RNA is generally detectable in upper respiratory specimens during the acute phase of infection. The lowest concentration of SARS-CoV-2 viral copies this assay can detect is 138 copies/mL. A negative result does not preclude SARS-Cov-2 infection and should not be used as the sole basis for treatment or other patient management decisions. A negative result may occur with  improper specimen collection/handling, submission of specimen other than nasopharyngeal swab, presence of viral mutation(s) within the areas targeted by this assay, and inadequate number of viral copies(<138 copies/mL). A negative result must be combined with clinical observations, patient history, and epidemiological information. The expected result is Negative.  Fact Sheet for Patients:   EntrepreneurPulse.com.au  Fact Sheet for Healthcare Providers:  IncredibleEmployment.be  This test is no t yet approved or cleared by the [place] FDA and  has been authorized for detection and/or diagnosis of SARS-CoV-2 by FDA under an Emergency Use Authorization (EUA). This EUA will remain  in effect (meaning this test can be used) for the duration of the COVID-19 declaration under Section 564(b)(1) of the Act, 21 U.S.C.section 360bbb-3(b)(1), unless the authorization is terminated  or revoked sooner.       Influenza A by PCR NEGATIVE NEGATIVE Final   Influenza B by PCR NEGATIVE NEGATIVE Final    Comment: (NOTE) The Xpert Xpress SARS-CoV-2/FLU/RSV plus assay is intended as an aid in the diagnosis of influenza from Nasopharyngeal swab specimens and should not be used as a sole basis for treatment. Nasal washings and aspirates are unacceptable for Xpert Xpress SARS-CoV-2/FLU/RSV testing.  Fact Sheet for Patients: EntrepreneurPulse.com.au  Fact Sheet for Healthcare Providers: IncredibleEmployment.be  This test is not yet approved or cleared by the [place] FDA and has been authorized for detection and/or diagnosis of SARS-CoV-2 by FDA under an Emergency Use Authorization (EUA). This EUA will remain in effect (meaning this test can be used) for the duration of the COVID-19 declaration under Section 564(b)(1) of the Act, 21 U.S.C. section 360bbb-3(b)(1), unless the authorization is terminated or revoked.  Performed at [facility], [address] [place], [place] [address]     RADIOLOGY:  EP PPM/ICD IMPLANT  Result Date: 04/01/2020 Successful Micra AV leadless pacemaker implantation     Management plans discussed with the patient, family and they are in agreement.  CODE STATUS:  Code Status History    Date Active Date Inactive Code Status Order ID Comments User Context    04/01/2020 2119 04/02/2020 4174 Full Code [id number]  Isaias Cowman, MD Inpatient   12/29/2017 1612 12/30/2017 1729 Full Code [id number]  Lovell Sheehan, MD Inpatient   Advance Care Planning Activity    Questions for Most Recent Historical Code Status (Order [id number])       TOTAL TIME TAKING CARE OF THIS PATIENT: 35 minutes.    Loletha Grayer M.D on 04/02/2020 at 7:21 PM  Between 7am to 6pm - Pager - [phone]  After 6pm go to [email or web address] - password [email or web address]  [facility]  CC: Primary care physician; Tama High III,  MD     

## 2020-04-02 NOTE — Progress Notes (Signed)
[facility] Cardiology [facility] Encounter Note  Patient: Jacob Rose / Admit Date: 03/29/2020 / Date of Encounter: 04/02/2020, 8:27 AM   Subjective: 2/20.  Patient overall has felt well overnight with no evidence of chest pain shortness of breath or congestive heart failure type symptoms.  Telemetry has shown no evidence of sick sinus syndrome or heart block or pauses.  The patient did have a thickened syncopal episode with a long pause and sinus arrest 6 causing his admission to the hospital.  This happened twice yesterday although currently he has had no further issues.  We have discussed at length further treatment options including pacemaker placement.  Due to the likely very infrequent episodes of syncope and pauses with sinus arrest, he would most easily be treated with a Micra pacemaker.  This would provide backup pacing for the rare episodes that he has causing his syncope.  We have had a long discussion with the patient and the family and they are agreeable at this time.  They understand the risks and benefits of the pacemaker placement as listed and include risks including right femoral vein bleeding bruising and infection as well as hemopericardium other rhythm disturbances and side effects of medications.  He is at low risk for conscious sedation  2/21.No acute changes in condition overnight.  Patient has not had any cardiovascular symptoms and/or syncope.  Telemetry shows normal sinus rhythm.  We have discussed at length again with the wife and the patient about relatively isolated episode of pauses causing syncope and the best treatment would be the [facility] pacemaker for backup only.  They understand the risks and benefits of this treatment option  2/22.  Patient tolerated Micra pacemaker placement well with no evidence of significant complication.  Patient feels very well at this time without evidence of concerns or significant.  Patient did have some troponin draw which were slightly  elevated but no current evidence of myocardial injury or acute coronary syndrome.  Telemetry shows normal sinus rhythm  Echocardiogram showing normal LV systolic function with ejection fraction of 55% and no evidence of significant valvular heart disease  Review of Systems: Positive for: None Negative for: Vision change, hearing change, syncope, dizziness, nausea, vomiting,diarrhea, bloody stool, stomach pain, cough, congestion, diaphoresis, urinary frequency, urinary pain,skin lesions, skin rashes Others previously listed  Objective: Telemetry: Normal sinus rhythm Physical Exam: Blood pressure 123/82, pulse 61, temperature 98 F (36.7 C), temperature source Oral, resp. rate 16, height 5\' 6"  (1.676 m), weight 95.7 kg, SpO2 96 %. Body mass index is 34.04 kg/m. General: Well developed, well nourished, in no acute distress. Head: Normocephalic, atraumatic, sclera non-icteric, no xanthomas, nares are without discharge. Neck: No apparent masses Lungs: Normal respirations with no wheezes, no rhonchi, no rales , no crackles   Heart: Regular rate and rhythm, normal S1 S2, no murmur, no rub, no gallop, PMI is normal size and placement, carotid upstroke normal without bruit, jugular venous pressure normal Abdomen: Soft, non-tender, non-distended with normoactive bowel sounds. No hepatosplenomegaly. Abdominal aorta is normal size without bruit Extremities: No edema, no clubbing, no cyanosis, no ulcers,  Peripheral: 2+ radial, 2+ femoral, 2+ dorsal pedal pulses Neuro: Alert and oriented. Moves all extremities spontaneously. Psych:  Responds to questions appropriately with a normal affect.   Intake/Output Summary (Last 24 hours) at 04/02/2020 0827 Last data filed at 04/01/2020 1845 Gross per 24 hour  Intake 480 ml  Output 500 ml  Net -20 ml    Inpatient Medications:  . allopurinol  300 mg Oral QHS  . aspirin EC  81 mg Oral Daily  . insulin aspart  0-6 Units Subcutaneous TID WC  . simvastatin   40 mg Oral QHS  . sodium chloride flush  3 mL Intravenous Q12H   Infusions:  . sodium chloride      Labs: Recent Labs    04/01/20 0502 04/02/20 0439  NA 132* 130*  K 4.2 4.4  CL 101 98  CO2 23 24  GLUCOSE 142* 129*  BUN 15 15  CREATININE 0.86 0.85  CALCIUM 9.5 9.2   No results for input(s): AST, ALT, ALKPHOS, BILITOT, PROT, ALBUMIN in the last 72 hours. Recent Labs    04/01/20 0502  WBC 11.7*  HGB 15.4  HCT 42.1  MCV 91.5  PLT 222   No results for input(s): CKTOTAL, CKMB, TROPONINI in the last 72 hours. Invalid input(s): POCBNP No results for input(s): HGBA1C in the last 72 hours.   Weights: Filed Weights   04/01/20 0407 04/01/20 0850 04/02/20 0449  Weight: 94.5 kg 94.3 kg 95.7 kg     Radiology/Studies:  CT Head Wo Contrast  Result Date: 03/29/2020 CLINICAL DATA:  Head trauma, moderate/severe. Neck trauma, impaired range of motion. Additional history provided: Syncopal episode with head trauma. EXAM: CT HEAD WITHOUT CONTRAST CT CERVICAL SPINE WITHOUT CONTRAST TECHNIQUE: Multidetector CT imaging of the head and cervical spine was performed following the standard protocol without intravenous contrast. Multiplanar CT image reconstructions of the cervical spine were also generated. COMPARISON:  Head CT 06/30/2013. cervical spine MRI 03/13/2010. FINDINGS: CT HEAD FINDINGS Brain: Cerebral volume is normal for age. There is no acute intracranial hemorrhage. No demarcated cortical infarct. No extra-axial fluid collection. No evidence of intracranial mass. No midline shift. Vascular: No hyperdense vessel.  Atherosclerotic calcifications Skull: Normal. Negative for fracture or focal lesion. Sinuses/Orbits: Visualized orbits show no acute finding. Trace bilateral ethmoid sinus mucosal thickening. CT CERVICAL SPINE FINDINGS Alignment: Reversal of the expected cervical lordosis. Trace C2-C3 and C3-C4 grade 1 anterolisthesis. Trace C5-C6 grade 1 retrolisthesis. Skull base and  vertebrae: The basion-dental and atlanto-dental intervals are maintained.No evidence of acute fracture to the cervical spine. Soft tissues and spinal canal: No prevertebral fluid or swelling. No visible canal hematoma. Disc levels: Cervical spondylosis with multilevel disc space narrowing, disc bulges and uncovertebral hypertrophy. Disc space narrowing is moderate at C4-C5 and C5-C6 and moderate/advanced at C6-C7. Multilevel bony neural foraminal narrowing greatest bilaterally at C4-C5, C5-C6 and C6-C7. Multilevel spinal canal stenosis. Most notably, disc bulges contribute to apparent moderate spinal canal stenosis at C4-C5 and C5-C6 Upper chest: No consolidation which lung apices small pneumothorax IMPRESSION: CT head: 1. No evidence of acute intracranial abnormality. 2. Minimal ethmoid sinus mucosal thickening. CT cervical spine: 1. No evidence of acute fracture to the cervical spine. 2. Nonspecific reversal of the expected cervical lordosis. 3. Trace C2-C3 and C3-C4 grade 1 anterolisthesis. 4. Cervical spondylosis as described and greatest at C4-C5, C5-C6 and C6-C7. Electronically Signed   By: Kellie Simmering DO   On: 03/29/2020 16:22   CT Angio Chest PE W and/or Wo Contrast  Result Date: 03/29/2020 CLINICAL DATA:  Chest pain with positive D-dimer EXAM: CT ANGIOGRAPHY CHEST WITH CONTRAST TECHNIQUE: Multidetector CT imaging of the chest was performed using the standard protocol during bolus administration of intravenous contrast. Multiplanar CT image reconstructions and MIPs were obtained to evaluate the vascular anatomy. CONTRAST:  67mL OMNIPAQUE IOHEXOL 350 MG/ML SOLN COMPARISON:  None. FINDINGS: Cardiovascular: Contrast injection is sufficient to  demonstrate satisfactory opacification of the pulmonary arteries to the segmental level. There is no pulmonary embolus or evidence of right heart strain. The size of the main pulmonary artery is normal. Heart size is normal, with no pericardial effusion. The course  and caliber of the aorta are normal. There is no atherosclerotic calcification. No acute aortic syndrome. Mediastinum/Nodes: No mediastinal, hilar or axillary lymphadenopathy. Normal visualized thyroid. Thoracic esophageal course is normal. Lungs/Pleura: Airways are patent. No pleural effusion, lobar consolidation, pneumothorax or pulmonary infarction. Upper Abdomen: Contrast bolus timing is not optimized for evaluation of the abdominal organs. The visualized portions of the organs of the upper abdomen are normal. Musculoskeletal: No chest wall abnormality. No bony spinal canal stenosis. Review of the MIP images confirms the above findings. IMPRESSION: No pulmonary embolus or other acute thoracic abnormality. Electronically Signed   By: Ulyses Jarred M.D.   On: 03/29/2020 19:20   CT Cervical Spine Wo Contrast  Result Date: 03/29/2020 CLINICAL DATA:  Head trauma, moderate/severe. Neck trauma, impaired range of motion. Additional history provided: Syncopal episode with head trauma. EXAM: CT HEAD WITHOUT CONTRAST CT CERVICAL SPINE WITHOUT CONTRAST TECHNIQUE: Multidetector CT imaging of the head and cervical spine was performed following the standard protocol without intravenous contrast. Multiplanar CT image reconstructions of the cervical spine were also generated. COMPARISON:  Head CT 06/30/2013. cervical spine MRI 03/13/2010. FINDINGS: CT HEAD FINDINGS Brain: Cerebral volume is normal for age. There is no acute intracranial hemorrhage. No demarcated cortical infarct. No extra-axial fluid collection. No evidence of intracranial mass. No midline shift. Vascular: No hyperdense vessel.  Atherosclerotic calcifications Skull: Normal. Negative for fracture or focal lesion. Sinuses/Orbits: Visualized orbits show no acute finding. Trace bilateral ethmoid sinus mucosal thickening. CT CERVICAL SPINE FINDINGS Alignment: Reversal of the expected cervical lordosis. Trace C2-C3 and C3-C4 grade 1 anterolisthesis. Trace C5-C6  grade 1 retrolisthesis. Skull base and vertebrae: The basion-dental and atlanto-dental intervals are maintained.No evidence of acute fracture to the cervical spine. Soft tissues and spinal canal: No prevertebral fluid or swelling. No visible canal hematoma. Disc levels: Cervical spondylosis with multilevel disc space narrowing, disc bulges and uncovertebral hypertrophy. Disc space narrowing is moderate at C4-C5 and C5-C6 and moderate/advanced at C6-C7. Multilevel bony neural foraminal narrowing greatest bilaterally at C4-C5, C5-C6 and C6-C7. Multilevel spinal canal stenosis. Most notably, disc bulges contribute to apparent moderate spinal canal stenosis at C4-C5 and C5-C6 Upper chest: No consolidation which lung apices small pneumothorax IMPRESSION: CT head: 1. No evidence of acute intracranial abnormality. 2. Minimal ethmoid sinus mucosal thickening. CT cervical spine: 1. No evidence of acute fracture to the cervical spine. 2. Nonspecific reversal of the expected cervical lordosis. 3. Trace C2-C3 and C3-C4 grade 1 anterolisthesis. 4. Cervical spondylosis as described and greatest at C4-C5, C5-C6 and C6-C7. Electronically Signed   By: Kellie Simmering DO   On: 03/29/2020 16:22   EP PPM/ICD IMPLANT  Result Date: 04/01/2020 Successful Micra AV leadless pacemaker implantation  ECHOCARDIOGRAM COMPLETE  Result Date: 03/31/2020    ECHOCARDIOGRAM REPORT   Patient Name:   Jacob Rose Date of Exam: 03/30/2020 Medical Rec #:  [id number]         Height:       66.0 in Accession #:    [id number]        Weight:       213.0 lb Date of Birth:  1960/08/28        BSA:          2.054 m Patient Age:  59 years          BP:           159/87 mmHg Patient Gender: M                 HR:           61 bpm. Exam Location:  [facility] Procedure: 2D Echo, Cardiac Doppler and Color Doppler Indications:     Syncope 780.2 / R55  History:         Patient has no prior history of Echocardiogram examinations.                   Signs/Symptoms:Shortness of Breath; Risk Factors:Hypertension.  Sonographer:     Alyse Low Roar Referring Phys:  [id number] Rhetta Mura Diagnosing Phys: Serafina Royals MD IMPRESSIONS  1. Left ventricular ejection fraction, by estimation, is 55 to 60%. The left ventricle has normal function. The left ventricle has no regional wall motion abnormalities. Left ventricular diastolic parameters are consistent with Grade II diastolic dysfunction (pseudonormalization).  2. Right ventricular systolic function is normal. The right ventricular size is normal.  3. The mitral valve is normal in structure. Trivial mitral valve regurgitation.  4. The aortic valve is normal in structure. Aortic valve regurgitation is not visualized. FINDINGS  Left Ventricle: Left ventricular ejection fraction, by estimation, is 55 to 60%. The left ventricle has normal function. The left ventricle has no regional wall motion abnormalities. The left ventricular internal cavity size was normal in size. There is  no left ventricular hypertrophy. Left ventricular diastolic parameters are consistent with Grade II diastolic dysfunction (pseudonormalization). Right Ventricle: The right ventricular size is normal. No increase in right ventricular wall thickness. Right ventricular systolic function is normal. Left Atrium: Left atrial size was normal in size. Right Atrium: Right atrial size was normal in size. Pericardium: There is no evidence of pericardial effusion. Mitral Valve: The mitral valve is normal in structure. Trivial mitral valve regurgitation. Tricuspid Valve: The tricuspid valve is normal in structure. Tricuspid valve regurgitation is trivial. Aortic Valve: The aortic valve is normal in structure. Aortic valve regurgitation is not visualized. Aortic valve peak gradient measures 9.7 mmHg. Pulmonic Valve: The pulmonic valve was normal in structure. Pulmonic valve regurgitation is not visualized. Aorta: The aortic root and ascending aorta are  structurally normal, with no evidence of dilitation. IAS/Shunts: No atrial level shunt detected by color flow Doppler.  LEFT VENTRICLE PLAX 2D LVIDd:         4.88 cm  Diastology LVIDs:         3.84 cm  LV e' medial:    7.18 cm/s LV PW:         0.93 cm  LV E/e' medial:  11.7 LV IVS:        1.05 cm  LV e' lateral:   12.80 cm/s LVOT diam:     1.70 cm  LV E/e' lateral: 6.6 LVOT Area:     2.27 cm  RIGHT VENTRICLE RV Mid diam:    3.03 cm RV S prime:     13.80 cm/s TAPSE (M-mode): 2.5 cm LEFT ATRIUM             Index       RIGHT ATRIUM           Index LA diam:        3.80 cm 1.85 cm/m  RA Area:     13.80 cm LA Vol (A2C):   53.9 ml 26.24 ml/m  RA Volume:   33.00 ml  16.07 ml/m LA Vol (A4C):   77.3 ml 37.63 ml/m LA Biplane Vol: 66.3 ml 32.28 ml/m  AORTIC VALVE                PULMONIC VALVE AV Area (Vmax): 1.38 cm    PV Vmax:        1.17 m/s AV Vmax:        156.00 cm/s PV Peak grad:   5.5 mmHg AV Peak Grad:   9.7 mmHg    RVOT Peak grad: 1 mmHg LVOT Vmax:      95.10 cm/s  AORTA Ao Root diam: 3.00 cm MITRAL VALVE MV Area (PHT): 4.21 cm    SHUNTS MV Decel Time: 180 msec    Systemic Diam: 1.70 cm MV E velocity: 84.00 cm/s MV A velocity: 81.40 cm/s MV E/A ratio:  1.03 MV A Prime:    10.8 cm/s Serafina Royals MD Electronically signed by Serafina Royals MD Signature Date/Time: 03/31/2020/5:52:31 AM    Final      Assessment and Recommendation  60 y.o. male with known hypertension diabetes hyperlipidemia chronic kidney disease stage III with a cardiac catheterization in the past showing normal coronary arteries with an event of syncope needing further treatment including pacemaker placement now tolerating well without evidence of significant symptoms at this time 1.  Begin ambulation today and follow for any further significant symptoms or issues that may arise.  If ambulating well no further significant concerns the patient is okay for discharge home from cardiac standpoint with follow-up in 1 to 2 weeks  Signed, Serafina Royals M.D. FACC

## 2020-04-02 NOTE — Discharge Instructions (Signed)
Can restart Glucophage (metformin) in two days

## 2020-04-02 NOTE — Progress Notes (Signed)
Patient discharged to home. Tele and IV d/c'd. Patient and wife verbalize understanding of discharge instructions.   

## 2020-06-12 ENCOUNTER — Emergency Department: Payer: Medicare Other

## 2020-06-12 ENCOUNTER — Observation Stay
Admission: EM | Admit: 2020-06-12 | Discharge: 2020-06-13 | Disposition: A | Discharge status: Home / Self Care | Payer: Medicare Other | Attending: Internal Medicine | Admitting: Internal Medicine

## 2020-06-12 ENCOUNTER — Other Ambulatory Visit: Payer: Self-pay

## 2020-06-12 DIAGNOSIS — E785 Hyperlipidemia, unspecified: Secondary | ICD-10-CM

## 2020-06-12 DIAGNOSIS — Z28310 Unvaccinated for covid-19: Secondary | ICD-10-CM | POA: Diagnosis not present

## 2020-06-12 DIAGNOSIS — Z96612 Presence of left artificial shoulder joint: Secondary | ICD-10-CM | POA: Insufficient documentation

## 2020-06-12 DIAGNOSIS — Z7984 Long term (current) use of oral hypoglycemic drugs: Secondary | ICD-10-CM | POA: Insufficient documentation

## 2020-06-12 DIAGNOSIS — N179 Acute kidney failure, unspecified: Secondary | ICD-10-CM | POA: Insufficient documentation

## 2020-06-12 DIAGNOSIS — Z87891 Personal history of nicotine dependence: Secondary | ICD-10-CM | POA: Diagnosis not present

## 2020-06-12 DIAGNOSIS — I10 Essential (primary) hypertension: Secondary | ICD-10-CM

## 2020-06-12 DIAGNOSIS — I498 Other specified cardiac arrhythmias: Secondary | ICD-10-CM | POA: Diagnosis not present

## 2020-06-12 DIAGNOSIS — I110 Hypertensive heart disease with heart failure: Secondary | ICD-10-CM | POA: Insufficient documentation

## 2020-06-12 DIAGNOSIS — I2510 Atherosclerotic heart disease of native coronary artery without angina pectoris: Secondary | ICD-10-CM | POA: Diagnosis not present

## 2020-06-12 DIAGNOSIS — Z20822 Contact with and (suspected) exposure to covid-19: Secondary | ICD-10-CM | POA: Insufficient documentation

## 2020-06-12 DIAGNOSIS — E7800 Pure hypercholesterolemia, unspecified: Secondary | ICD-10-CM

## 2020-06-12 DIAGNOSIS — R079 Chest pain, unspecified: Secondary | ICD-10-CM | POA: Diagnosis not present

## 2020-06-12 DIAGNOSIS — R55 Syncope and collapse: Secondary | ICD-10-CM | POA: Diagnosis present

## 2020-06-12 DIAGNOSIS — Z950 Presence of cardiac pacemaker: Secondary | ICD-10-CM | POA: Insufficient documentation

## 2020-06-12 DIAGNOSIS — Z8616 Personal history of COVID-19: Secondary | ICD-10-CM | POA: Insufficient documentation

## 2020-06-12 DIAGNOSIS — I493 Ventricular premature depolarization: Secondary | ICD-10-CM

## 2020-06-12 DIAGNOSIS — E871 Hypo-osmolality and hyponatremia: Secondary | ICD-10-CM | POA: Diagnosis not present

## 2020-06-12 DIAGNOSIS — I5032 Chronic diastolic (congestive) heart failure: Secondary | ICD-10-CM

## 2020-06-12 DIAGNOSIS — Z79899 Other long term (current) drug therapy: Secondary | ICD-10-CM | POA: Diagnosis not present

## 2020-06-12 DIAGNOSIS — E119 Type 2 diabetes mellitus without complications: Secondary | ICD-10-CM | POA: Diagnosis not present

## 2020-06-12 DIAGNOSIS — I455 Other specified heart block: Secondary | ICD-10-CM

## 2020-06-12 DIAGNOSIS — E1169 Type 2 diabetes mellitus with other specified complication: Secondary | ICD-10-CM

## 2020-06-12 LAB — HEPATIC FUNCTION PANEL
ALT: 26 U/L (ref 0–44)
AST: 40 U/L (ref 15–41)
Albumin: 4.5 g/dL (ref 3.5–5.0)
Alkaline Phosphatase: 76 U/L (ref 38–126)
Bilirubin, Direct: 0.4 mg/dL — ABNORMAL HIGH (ref 0.0–0.2)
Indirect Bilirubin: 1.1 mg/dL — ABNORMAL HIGH (ref 0.3–0.9)
Total Bilirubin: 1.5 mg/dL — ABNORMAL HIGH (ref 0.3–1.2)
Total Protein: 8 g/dL (ref 6.5–8.1)

## 2020-06-12 LAB — RESP PANEL BY RT-PCR (FLU A&B, COVID) ARPGX2
Influenza A by PCR: NEGATIVE
Influenza B by PCR: NEGATIVE
SARS Coronavirus 2 by RT PCR: NEGATIVE

## 2020-06-12 LAB — TROPONIN I (HIGH SENSITIVITY)
Troponin I (High Sensitivity): 3 ng/L (ref <18)
Troponin I (High Sensitivity): 3 ng/L (ref <18)

## 2020-06-12 LAB — CBC
HCT: 43.7 % (ref 39.0–52.0)
Hemoglobin: 15.4 g/dL (ref 13.0–17.0)
MCH: 32.2 pg (ref 26.0–34.0)
MCHC: 35.2 g/dL (ref 30.0–36.0)
MCV: 91.4 fL (ref 80.0–100.0)
Platelets: 187 10*3/uL (ref 150–400)
RBC: 4.78 MIL/uL (ref 4.22–5.81)
RDW: 12.4 % (ref 11.5–15.5)
WBC: 6.3 10*3/uL (ref 4.0–10.5)
nRBC: 0 % (ref 0.0–0.2)

## 2020-06-12 LAB — BASIC METABOLIC PANEL
Anion gap: 11 (ref 5–15)
BUN: 14 mg/dL (ref 8–23)
CO2: 23 mmol/L (ref 22–32)
Calcium: 9.4 mg/dL (ref 8.9–10.3)
Chloride: 98 mmol/L (ref 98–111)
Creatinine, Ser: 0.99 mg/dL (ref 0.61–1.24)
GFR, Estimated: >60 mL/min (ref >60)
Glucose, Bld: 149 mg/dL — ABNORMAL HIGH (ref 70–99)
Potassium: 5.4 mmol/L — ABNORMAL HIGH (ref 3.5–5.1)
Sodium: 132 mmol/L — ABNORMAL LOW (ref 135–145)

## 2020-06-12 LAB — POTASSIUM: Potassium: 4.8 mmol/L (ref 3.5–5.1)

## 2020-06-12 LAB — MAGNESIUM: Magnesium: 2 mg/dL (ref 1.7–2.4)

## 2020-06-12 MED ORDER — METOPROLOL TARTRATE 25 MG PO TABS
12.5000 mg | ORAL_TABLET | Freq: Once | ORAL | Status: AC
  Administered 2020-06-12: 12.5 mg via ORAL
  Filled 2020-06-12: qty 1

## 2020-06-12 MED ORDER — LISINOPRIL 5 MG PO TABS
5.0000 mg | ORAL_TABLET | Freq: Every day | ORAL | Status: DC
  Administered 2020-06-12: 5 mg via ORAL
  Filled 2020-06-12: qty 1

## 2020-06-12 MED ORDER — SODIUM CHLORIDE 0.9 % IV BOLUS
500.0000 mL | Freq: Once | INTRAVENOUS | Status: AC
  Administered 2020-06-12: 500 mL via INTRAVENOUS

## 2020-06-12 MED ORDER — ENOXAPARIN SODIUM 60 MG/0.6ML IJ SOSY
0.5000 mg/kg | PREFILLED_SYRINGE | INTRAMUSCULAR | Status: DC
  Administered 2020-06-12 – 2020-06-13 (×2): 45 mg via SUBCUTANEOUS
  Filled 2020-06-12: qty 0.6
  Filled 2020-06-12: qty 0.45

## 2020-06-12 MED ORDER — ACETAMINOPHEN 325 MG PO TABS: 650.0000 mg | ORAL_TABLET | ORAL | Status: DC | PRN

## 2020-06-12 MED ORDER — MORPHINE SULFATE (PF) 2 MG/ML IV SOLN: 2.0000 mg | INTRAVENOUS | Status: DC | PRN

## 2020-06-12 MED ORDER — ONDANSETRON HCL 4 MG/2ML IJ SOLN
4.0000 mg | Freq: Once | INTRAMUSCULAR | Status: AC
  Administered 2020-06-12: 4 mg via INTRAVENOUS
  Filled 2020-06-12: qty 2

## 2020-06-12 MED ORDER — ASPIRIN EC 81 MG PO TBEC
81.0000 mg | DELAYED_RELEASE_TABLET | Freq: Every day | ORAL | Status: DC
  Administered 2020-06-12 – 2020-06-13 (×2): 81 mg via ORAL
  Filled 2020-06-12 (×2): qty 1

## 2020-06-12 MED ORDER — ATORVASTATIN CALCIUM 20 MG PO TABS
40.0000 mg | ORAL_TABLET | Freq: Every day | ORAL | Status: DC
  Administered 2020-06-12: 40 mg via ORAL
  Filled 2020-06-12: qty 2

## 2020-06-12 MED ORDER — ONDANSETRON HCL 4 MG/2ML IJ SOLN: 4.0000 mg | Freq: Four times a day (QID) | INTRAMUSCULAR | Status: DC | PRN

## 2020-06-12 MED ORDER — MORPHINE SULFATE (PF) 4 MG/ML IV SOLN
4.0000 mg | Freq: Once | INTRAVENOUS | Status: AC
  Administered 2020-06-12: 4 mg via INTRAVENOUS
  Filled 2020-06-12: qty 1

## 2020-06-12 MED ORDER — METOPROLOL TARTRATE 25 MG PO TABS
12.5000 mg | ORAL_TABLET | Freq: Two times a day (BID) | ORAL | Status: DC
  Administered 2020-06-13: 12.5 mg via ORAL
  Filled 2020-06-12 (×2): qty 1

## 2020-06-12 MED ORDER — METFORMIN HCL ER 500 MG PO TB24: 500.0000 mg | ORAL_TABLET | Freq: Every day | ORAL | Status: DC

## 2020-06-12 MED ORDER — ALLOPURINOL 300 MG PO TABS
300.0000 mg | ORAL_TABLET | Freq: Every day | ORAL | Status: DC
  Administered 2020-06-12: 300 mg via ORAL
  Filled 2020-06-12 (×2): qty 1

## 2020-06-12 NOTE — ED Notes (Signed)
Kassie RN aware of assigned bed
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
## 2020-06-12 NOTE — ED Notes (Addendum)
Pt noted to have frequent PVCs while this RN in room. Pt endorsing feeling of near-syncope that resolved when PVCs stopper. MD Jari Pigg made aware. This RN reviewed cardiac monitor and printed runs of PVCs for [facility] MD to review.

## 2020-06-12 NOTE — ED Notes (Signed)
Pt still experiencing frequent PVCs, MD Jari Pigg made aware.

## 2020-06-12 NOTE — ED Notes (Signed)
[employer] report given to [facility] MD.
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
## 2020-06-12 NOTE — ED Notes (Signed)
Transportation requested for transfer to assigned inpatient room.

## 2020-06-12 NOTE — ED Notes (Signed)
Patient transported to inpatient unit.  

## 2020-06-12 NOTE — ED Triage Notes (Addendum)
Pt c/o several episodes of feeling like he is going to pass out with chest pain this morning, states he has a pass maker,

## 2020-06-13 DIAGNOSIS — R079 Chest pain, unspecified: Secondary | ICD-10-CM | POA: Diagnosis not present

## 2020-06-13 DIAGNOSIS — E7800 Pure hypercholesterolemia, unspecified: Secondary | ICD-10-CM | POA: Diagnosis not present

## 2020-06-13 DIAGNOSIS — E119 Type 2 diabetes mellitus without complications: Secondary | ICD-10-CM | POA: Diagnosis not present

## 2020-06-13 DIAGNOSIS — I498 Other specified cardiac arrhythmias: Secondary | ICD-10-CM | POA: Diagnosis not present

## 2020-06-13 LAB — BASIC METABOLIC PANEL
Anion gap: 9 (ref 5–15)
BUN: 15 mg/dL (ref 8–23)
CO2: 25 mmol/L (ref 22–32)
Calcium: 9.4 mg/dL (ref 8.9–10.3)
Chloride: 102 mmol/L (ref 98–111)
Creatinine, Ser: 0.89 mg/dL (ref 0.61–1.24)
GFR, Estimated: >60 mL/min (ref >60)
Glucose, Bld: 120 mg/dL — ABNORMAL HIGH (ref 70–99)
Potassium: 3.8 mmol/L (ref 3.5–5.1)
Sodium: 136 mmol/L (ref 135–145)

## 2020-06-13 LAB — CBC
HCT: 40.5 % (ref 39.0–52.0)
Hemoglobin: 14.5 g/dL (ref 13.0–17.0)
MCH: 32.4 pg (ref 26.0–34.0)
MCHC: 35.8 g/dL (ref 30.0–36.0)
MCV: 90.6 fL (ref 80.0–100.0)
Platelets: 181 10*3/uL (ref 150–400)
RBC: 4.47 MIL/uL (ref 4.22–5.81)
RDW: 12.3 % (ref 11.5–15.5)
WBC: 9.3 10*3/uL (ref 4.0–10.5)
nRBC: 0 % (ref 0.0–0.2)

## 2020-06-13 LAB — GLUCOSE, CAPILLARY
Glucose-Capillary: 141 mg/dL — ABNORMAL HIGH (ref 70–99)
Glucose-Capillary: 164 mg/dL — ABNORMAL HIGH (ref 70–99)

## 2020-06-13 LAB — NM MYOCAR MULTI W/SPECT W/WALL MOTION / EF
Estimated workload: 1 METS
Exercise duration (min): 1 min
Exercise duration (sec): 2 s
LV dias vol: 139 mL (ref 62–150)
LV sys vol: 51 mL
MPHR: 159 {beats}/min
Peak HR: 96 {beats}/min
Percent HR: 60 %
Rest HR: 51 {beats}/min
SDS: 0
SRS: 8
SSS: 2
TID: 1.04

## 2020-06-13 LAB — HIV ANTIBODY (ROUTINE TESTING W REFLEX): HIV Screen 4th Generation wRfx: NONREACTIVE

## 2020-06-13 MED ORDER — METOPROLOL TARTRATE 25 MG PO TABS: 12.5000 mg | ORAL_TABLET | Freq: Two times a day (BID) | ORAL | 0 refills | Status: AC

## 2020-06-13 MED ORDER — REGADENOSON 0.4 MG/5ML IV SOLN
0.4000 mg | Freq: Once | INTRAVENOUS | Status: AC
  Administered 2020-06-13: 0.4 mg via INTRAVENOUS

## 2020-06-13 MED ORDER — TECHNETIUM TC 99M TETROFOSMIN IV KIT
30.0000 | PACK | Freq: Once | INTRAVENOUS | Status: AC | PRN
  Administered 2020-06-13: 32.03 via INTRAVENOUS

## 2020-06-13 MED ORDER — INSULIN ASPART 100 UNIT/ML IJ SOLN
0.0000 [IU] | Freq: Three times a day (TID) | INTRAMUSCULAR | Status: DC
  Administered 2020-06-13: 2 [IU] via SUBCUTANEOUS
  Filled 2020-06-13: qty 1

## 2020-06-13 MED ORDER — TECHNETIUM TC 99M TETROFOSMIN IV KIT
10.0000 | PACK | Freq: Once | INTRAVENOUS | Status: AC | PRN
  Administered 2020-06-13: 10.2 via INTRAVENOUS

## 2020-06-13 NOTE — Discharge Summary (Signed)
Physician Discharge Summary  Jacob Rose VQM:[id number] DOB: 09/14/1960 DOA: 06/12/2020  PCP: Adin Hector, MD  Admit date: 06/12/2020 Discharge date: 06/13/2020  Admitted From: home Disposition: home  Recommendations for Outpatient Follow-up:  1. Follow up with PCP in 1-2 weeks 2. F/u cardio, Dr. Nehemiah Massed, in 1-2 weeks   Home Health: no  Equipment/Devices:  Discharge Condition: stable  CODE STATUS: full  Diet recommendation: Heart Healthy / Carb Modified  Brief/Interim Summary: HPI was taken from Dr. Tobie Poet: Jacob Rose is a 60 y.o. male with medical history significant for hypertension, hyperlipidemia, non-insulin-dependent diabetes mellitus, heart failure preserved ejection fraction, tobacco use, erectile dysfunction, presents to the emergency department for chief concerns of near syncope episode.  He reports that the near-syncope episode happened at approximately 3:30 AM, waking him up from sleep.  He endorses that he felt like he was going to pass out, he was laying down and in bed, lasted about several seconds. It went away on its own.   On Monday, 06/10/20, well walking up to his house, he had an episode where he felt like he was going to pass out with associated sweating.  This lasted several seconds.  Patient endorses sitting down on his chair on the porch.  He does not know if sitting down improved his symptoms.  On Tuesday, 06/11/20, he endorsed feeling dull chest pain, right chest/breast, lasting all day.  He denies bilateral upper extremity radiation/pain.  He denies jaw and neck pain.  He reports negative for abnormal taste in his mouth.   He endorses nausea with these episode. He denies shortness of breath, vomiting, vision changes, abdominal pain, dysuria, hematuria, swelling in his legs, diarrhea, lost of consicionsness.   Social history: He lives at home with his wife.  He dips tobacco daily, drinks etoh (3-4 of 8 oz wine cooler daily).  He denies use of  recreational drugs. 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   Vaccination: not vaccinated for covid 19. He tested positive for covid 19 in January 2022.   Hospital course from Dr. Jimmye Norman 06/13/20: Pt presented w/ atypical chest pain. Pt had cardiac stress test that was normal. No further inpatient cardiac work-up was recommended. Pt can f/u outpatient w/ [facility] in 1-2 weeks, pt verbalized his understanding. For more information please previous progress/consult notes.   Discharge Diagnoses:  Principal Problem:   Chest pain Active Problems:   Sinus pause   Chronic diastolic CHF (congestive heart failure) (HCC)   Hypercholesteremia   Essential hypertension   Type 2 diabetes mellitus with hyperlipidemia (HCC)   Diabetes mellitus type 2, noninsulin dependent (HCC)  Chest pain: etiology unclear, unlikely cardiac. Troponin neg x 2. Continue on tele. Cardiac stress was normal, no ischemia,   Symptomatic bradycardia: likely secondary to BB use. Continue on tele   HTN: continue on home dose of lisinopril   HLD: continue on statin   DM2: likely poorly controlled. Continue on SSI w/ accuchecks   Chronic diastolic CHF: appears euvolemic. Continue on lisinopril, metoprolol   Hyponatremia: resolved   Hx of gout: continue on home dose of allopurinol    Discharge Instructions  Discharge Instructions    Diet - low sodium heart healthy   Complete by: As directed    Diet Carb Modified   Complete by: As directed    Discharge instructions   Complete by: As directed    F/u w/ PCP in 1-2 weeks. F/u  cardio in 1-2 weeks   Increase activity slowly   Complete by: As directed      Allergies as of 06/13/2020      Reactions   Flexeril [cyclobenzaprine] Other (See Comments)   Bad dreams      Medication List    STOP taking these medications   simvastatin 40 MG tablet Commonly known as: ZOCOR     TAKE these medications   allopurinol 300  MG tablet Commonly known as: ZYLOPRIM Take 300 mg by mouth at bedtime.   Aspirin 81 81 MG EC tablet Generic drug: aspirin Take 81 mg by mouth daily. Swallow whole.   atorvastatin 40 MG tablet Commonly known as: LIPITOR Take 40 mg by mouth daily.   lisinopril 10 MG tablet Commonly known as: ZESTRIL Take 0.5 tablets (5 mg total) by mouth at bedtime. What changed:   how much to take  when to take this   metFORMIN 500 MG 24 hr tablet Commonly known as: GLUCOPHAGE-XR Take 500 mg by mouth daily.   metoprolol tartrate 25 MG tablet Commonly known as: LOPRESSOR Take 0.5 tablets (12.5 mg total) by mouth 2 (two) times daily.       Allergies  Allergen Reactions  . Flexeril [Cyclobenzaprine] Other (See Comments)    Bad dreams    Consultations:  Cardio, Dr. Clayborn Bigness   Procedures/Studies: DG Chest 2 View  Result Date: 06/12/2020 CLINICAL DATA:  Chest pain. EXAM: CHEST - 2 VIEW COMPARISON:  CT 03/29/2020.  Chest x-ray 03/17/2015. FINDINGS: Mediastinum and hilar structures normal. Cardiac monitoring device noted over the chest. Heart size normal. No focal infiltrate. No pleural effusion or pneumothorax. Left shoulder replacement. Degenerative changes thoracic spine. Surgical clips right upper quadrant. IMPRESSION: No acute cardiopulmonary disease. Electronically Signed   By: Marcello Moores  Register   On: 06/12/2020 07:45   NM Myocar Multi W/Spect W/Wall Motion / EF  Result Date: 06/13/2020  The study is normal.  This is a low risk study.  The left ventricular ejection fraction is normal (55-65%).  There was no ST segment deviation noted during stress.  Negative lexiscan stress LV function normal No evidence of ischemia Low risk study       Subjective: Pt denies any chest pain or shortness of breath    Discharge Exam: Vitals:   06/13/20 0714 06/13/20 1128  BP: 109/63 117/62  Pulse: (!) 51 (!) 50  Resp: 18 18  Temp: 98.1 F (36.7 C) 97.6 F (36.4 C)  SpO2: 100% 98%    Vitals:   06/12/20 2119 06/13/20 0447 06/13/20 0714 06/13/20 1128  BP: (!) 158/75 (!) 104/58 109/63 117/62  Pulse: (!) 49 (!) 55 (!) 51 (!) 50  Resp: 18 18 18 18   Temp: 97.9 F (36.6 C) 98.1 F (36.7 C) 98.1 F (36.7 C) 97.6 F (36.4 C)  TempSrc:   Oral   SpO2: 97% 98% 100% 98%  Weight: 90.7 kg 90.7 kg 90.7 kg   Height:        General: Pt is alert, awake, not in acute distress Cardiovascular: S1/S2 +, no rubs, no gallops Respiratory: CTA bilaterally, no wheezing, no rhonchi Abdominal: Soft, NT, obese, bowel sounds + Extremities: no cyanosis    The results of significant diagnostics from this hospitalization (including imaging, microbiology, ancillary and laboratory) are listed below for reference.     Microbiology: Recent Results (from the past 240 hour(s))  Resp Panel by RT-PCR (Flu A&B, Covid) Nasopharyngeal Swab     Status: None   Collection Time:  06/12/20  9:21 AM   Specimen: Nasopharyngeal Swab; Nasopharyngeal(NP) swabs in vial transport medium  Result Value Ref Range Status   SARS Coronavirus 2 by RT PCR NEGATIVE NEGATIVE Final    Comment: (NOTE) SARS-CoV-2 target nucleic acids are NOT DETECTED.  The SARS-CoV-2 RNA is generally detectable in upper respiratory specimens during the acute phase of infection. The lowest concentration of SARS-CoV-2 viral copies this assay can detect is 138 copies/mL. A negative result does not preclude SARS-Cov-2 infection and should not be used as the sole basis for treatment or other patient management decisions. A negative result may occur with  improper specimen collection/handling, submission of specimen other than nasopharyngeal swab, presence of viral mutation(s) within the areas targeted by this assay, and inadequate number of viral copies(<138 copies/mL). A negative result must be combined with clinical observations, patient history, and epidemiological information. The expected result is Negative.  Fact Sheet for  Patients:  EntrepreneurPulse.com.au  Fact Sheet for Healthcare Providers:  IncredibleEmployment.be  This test is no t yet approved or cleared by the [place] FDA and  has been authorized for detection and/or diagnosis of SARS-CoV-2 by FDA under an Emergency Use Authorization (EUA). This EUA will remain  in effect (meaning this test can be used) for the duration of the COVID-19 declaration under Section 564(b)(1) of the Act, 21 U.S.C.section 360bbb-3(b)(1), unless the authorization is terminated  or revoked sooner.       Influenza A by PCR NEGATIVE NEGATIVE Final   Influenza B by PCR NEGATIVE NEGATIVE Final    Comment: (NOTE) The Xpert Xpress SARS-CoV-2/FLU/RSV plus assay is intended as an aid in the diagnosis of influenza from Nasopharyngeal swab specimens and should not be used as a sole basis for treatment. Nasal washings and aspirates are unacceptable for Xpert Xpress SARS-CoV-2/FLU/RSV testing.  Fact Sheet for Patients: EntrepreneurPulse.com.au  Fact Sheet for Healthcare Providers: IncredibleEmployment.be  This test is not yet approved or cleared by the [place] FDA and has been authorized for detection and/or diagnosis of SARS-CoV-2 by FDA under an Emergency Use Authorization (EUA). This EUA will remain in effect (meaning this test can be used) for the duration of the COVID-19 declaration under Section 564(b)(1) of the Act, 21 U.S.C. section 360bbb-3(b)(1), unless the authorization is terminated or revoked.  Performed at [facility], [address] [place], [place] [address]      Labs: BNP (last 3 results) Recent Labs    03/29/20 1425  BNP 41.2   Basic Metabolic Panel: Recent Labs  Lab 06/12/20 0710 06/12/20 0921 06/13/20 0326  NA 132*  --  136  K 5.4* 4.8 3.8  CL 98  --  102  CO2 23  --  25  GLUCOSE 149*  --  120*  BUN 14  --  15  CREATININE 0.99  --  0.89   CALCIUM 9.4  --  9.4  MG  --  2.0  --    Liver Function Tests: Recent Labs  Lab 06/12/20 0710  AST 40  ALT 26  ALKPHOS 76  BILITOT 1.5*  PROT 8.0  ALBUMIN 4.5   No results for input(s): LIPASE, AMYLASE in the last 168 hours. No results for input(s): AMMONIA in the last 168 hours. CBC: Recent Labs  Lab 06/12/20 0710 06/13/20 0326  WBC 6.3 9.3  HGB 15.4 14.5  HCT 43.7 40.5  MCV 91.4 90.6  PLT 187 181   Cardiac Enzymes: No results for input(s): CKTOTAL, CKMB, CKMBINDEX, TROPONINI in the last 168  hours. BNP: Invalid input(s): POCBNP CBG: Recent Labs  Lab 06/13/20 0819 06/13/20 1128  GLUCAP 141* 164*   D-Dimer No results for input(s): DDIMER in the last 72 hours. Hgb A1c No results for input(s): HGBA1C in the last 72 hours. Lipid Profile No results for input(s): CHOL, HDL, LDLCALC, TRIG, CHOLHDL, LDLDIRECT in the last 72 hours. Thyroid function studies No results for input(s): TSH, T4TOTAL, T3FREE, THYROIDAB in the last 72 hours.  Invalid input(s): FREET3 Anemia work up No results for input(s): VITAMINB12, FOLATE, FERRITIN, TIBC, IRON, RETICCTPCT in the last 72 hours. Urinalysis    Component Value Date/Time   COLORURINE STRAW (A) 03/29/2020 1429   APPEARANCEUR CLEAR (A) 03/29/2020 1429   LABSPEC 1.005 03/29/2020 1429   PHURINE 8.0 03/29/2020 1429   GLUCOSEU NEGATIVE 03/29/2020 1429   HGBUR NEGATIVE 03/29/2020 1429   BILIRUBINUR NEGATIVE 03/29/2020 1429   KETONESUR NEGATIVE 03/29/2020 1429   PROTEINUR NEGATIVE 03/29/2020 1429   NITRITE NEGATIVE 03/29/2020 1429   LEUKOCYTESUR NEGATIVE 03/29/2020 1429   Sepsis Labs Invalid input(s): PROCALCITONIN,  WBC,  LACTICIDVEN Microbiology Recent Results (from the past 240 hour(s))  Resp Panel by RT-PCR (Flu A&B, Covid) Nasopharyngeal Swab     Status: None   Collection Time: 06/12/20  9:21 AM   Specimen: Nasopharyngeal Swab; Nasopharyngeal(NP) swabs in vial transport medium  Result Value Ref Range Status   SARS  Coronavirus 2 by RT PCR NEGATIVE NEGATIVE Final    Comment: (NOTE) SARS-CoV-2 target nucleic acids are NOT DETECTED.  The SARS-CoV-2 RNA is generally detectable in upper respiratory specimens during the acute phase of infection. The lowest concentration of SARS-CoV-2 viral copies this assay can detect is 138 copies/mL. A negative result does not preclude SARS-Cov-2 infection and should not be used as the sole basis for treatment or other patient management decisions. A negative result may occur with  improper specimen collection/handling, submission of specimen other than nasopharyngeal swab, presence of viral mutation(s) within the areas targeted by this assay, and inadequate number of viral copies(<138 copies/mL). A negative result must be combined with clinical observations, patient history, and epidemiological information. The expected result is Negative.  Fact Sheet for Patients:  EntrepreneurPulse.com.au  Fact Sheet for Healthcare Providers:  IncredibleEmployment.be  This test is no t yet approved or cleared by the [place] FDA and  has been authorized for detection and/or diagnosis of SARS-CoV-2 by FDA under an Emergency Use Authorization (EUA). This EUA will remain  in effect (meaning this test can be used) for the duration of the COVID-19 declaration under Section 564(b)(1) of the Act, 21 U.S.C.section 360bbb-3(b)(1), unless the authorization is terminated  or revoked sooner.       Influenza A by PCR NEGATIVE NEGATIVE Final   Influenza B by PCR NEGATIVE NEGATIVE Final    Comment: (NOTE) The Xpert Xpress SARS-CoV-2/FLU/RSV plus assay is intended as an aid in the diagnosis of influenza from Nasopharyngeal swab specimens and should not be used as a sole basis for treatment. Nasal washings and aspirates are unacceptable for Xpert Xpress SARS-CoV-2/FLU/RSV testing.  Fact Sheet for  Patients: EntrepreneurPulse.com.au  Fact Sheet for Healthcare Providers: IncredibleEmployment.be  This test is not yet approved or cleared by the [place] FDA and has been authorized for detection and/or diagnosis of SARS-CoV-2 by FDA under an Emergency Use Authorization (EUA). This EUA will remain in effect (meaning this test can be used) for the duration of the COVID-19 declaration under Section 564(b)(1) of the Act, 21 U.S.C. section 360bbb-3(b)(1), unless the authorization  is terminated or revoked.  Performed at [facility], [address], [place], [place] [address]      Time coordinating discharge: Over 30 minutes  SIGNED:   Wyvonnia Dusky, MD  [facility] 06/13/2020, 3:16 PM Pager   If 7PM-7AM, please contact night-coverage

## 2020-06-13 NOTE — Progress Notes (Signed)
[facility] Cardiology    SUBJECTIVE: Patient states he had episodes of near syncope but feels much improved since starting beta-blockers he has had no further episodes feels back to normal no palpitation tachycardia weak feeling   Vitals:   06/12/20 1930 06/12/20 2119 06/13/20 0447 06/13/20 0714  BP: 137/65 (!) 158/75 (!) 104/58 109/63  Pulse: (!) 54 (!) 49 (!) 55 (!) 51  Resp: 13 18 18 18   Temp:  97.9 F (36.6 C) 98.1 F (36.7 C) 98.1 F (36.7 C)  TempSrc:    Oral  SpO2: 98% 97% 98% 100%  Weight:  90.7 kg 90.7 kg   Height:         Intake/Output Summary (Last 24 hours) at 06/13/2020 0844 Last data filed at 06/13/2020 0446 Gross per 24 hour  Intake --  Output 750 ml  Net -750 ml      PHYSICAL EXAM  General: Well developed, well nourished, in no acute distress HEENT:  Normocephalic and atramatic Neck:  No JVD.  Lungs: Clear bilaterally to auscultation and percussion. Heart: Bradycardia. Normal S1 and S2 without gallops or murmurs.  Abdomen: Bowel sounds are positive, abdomen soft and non-tender  Msk:  Back normal, normal gait. Normal strength and tone for age. Extremities: No clubbing, cyanosis or edema.   Neuro: Alert and oriented X 3. Psych:  Good affect, responds appropriately   LABS: Basic Metabolic Panel: Recent Labs    06/12/20 0710 06/12/20 0921 06/13/20 0326  NA 132*  --  136  K 5.4* 4.8 3.8  CL 98  --  102  CO2 23  --  25  GLUCOSE 149*  --  120*  BUN 14  --  15  CREATININE 0.99  --  0.89  CALCIUM 9.4  --  9.4  MG  --  2.0  --    Liver Function Tests: Recent Labs    06/12/20 0710  AST 40  ALT 26  ALKPHOS 76  BILITOT 1.5*  PROT 8.0  ALBUMIN 4.5   No results for input(s): LIPASE, AMYLASE in the last 72 hours. CBC: Recent Labs    06/12/20 0710 06/13/20 0326  WBC 6.3 9.3  HGB 15.4 14.5  HCT 43.7 40.5  MCV 91.4 90.6  PLT 187 181   Cardiac Enzymes: No results for input(s): CKTOTAL, CKMB, CKMBINDEX, TROPONINI in the last 72  hours. BNP: Invalid input(s): POCBNP D-Dimer: No results for input(s): DDIMER in the last 72 hours. Hemoglobin A1C: No results for input(s): HGBA1C in the last 72 hours. Fasting Lipid Panel: No results for input(s): CHOL, HDL, LDLCALC, TRIG, CHOLHDL, LDLDIRECT in the last 72 hours. Thyroid Function Tests: No results for input(s): TSH, T4TOTAL, T3FREE, THYROIDAB in the last 72 hours.  Invalid input(s): FREET3 Anemia Panel: No results for input(s): VITAMINB12, FOLATE, FERRITIN, TIBC, IRON, RETICCTPCT in the last 72 hours.  DG Chest 2 View  Result Date: 06/12/2020 CLINICAL DATA:  Chest pain. EXAM: CHEST - 2 VIEW COMPARISON:  CT 03/29/2020.  Chest x-ray 03/17/2015. FINDINGS: Mediastinum and hilar structures normal. Cardiac monitoring device noted over the chest. Heart size normal. No focal infiltrate. No pleural effusion or pneumothorax. Left shoulder replacement. Degenerative changes thoracic spine. Surgical clips right upper quadrant. IMPRESSION: No acute cardiopulmonary disease. Electronically Signed   By: Marcello Moores  Register   On: 06/12/2020 07:45     Echo 03/29/2020 preserved left ventricular function EF of at least 55%  TELEMETRY: Sinus bradycardia rate of 50:  ASSESSMENT AND PLAN:  Principal Problem:   Chest  pain Active Problems:   Sinus pause   Chronic diastolic CHF (congestive heart failure) (HCC)   Hypercholesteremia   Essential hypertension   Type 2 diabetes mellitus with hyperlipidemia (HCC)   Diabetes mellitus type 2, noninsulin dependent ([place]) Near syncope PVCs/ PACs  Plan Agree with telemetry rule out microinfarction follow-up EKGs troponins Continue beta-blockade therapy to help suppress PVCs and PACs We will follow-up in pacemaker interrogation as an outpatient Proceed with inpatient functional study [place] for evaluation of atypical chest pain Continue diabetes management and control Statin therapy for hyperlipidemia Advised patient refrain from  alcohol Consider sleep study for evaluation of possible obstructive sleep apnea   Yolonda Kida, MD 06/13/2020 8:44 AM

## 2020-06-13 NOTE — Care Management Obs Status (Signed)
[facility] NOTIFICATION   Patient Details  Name: Jacob Rose MRN: [id number] Date of Birth: [birth year]   Medicare Observation Status Notification Given:       Eileen Stanford, [place] 06/13/2020, 3:59 PM

## 2020-06-13 NOTE — Progress Notes (Signed)
Patient discharged per orders, PIV/tele removed. Discharged instructions reviewed and given to patient/wife; agreeable. Patient taken down to main entrance/vehicle via wheelchair by volunteer service.

## 2022-05-26 ENCOUNTER — Other Ambulatory Visit: Payer: Self-pay

## 2022-05-26 DIAGNOSIS — R7989 Other specified abnormal findings of blood chemistry: Secondary | ICD-10-CM

## 2022-05-27 ENCOUNTER — Ambulatory Visit
Admission: RE | Admit: 2022-05-27 | Discharge: 2022-05-27 | Disposition: A | Discharge status: Home / Self Care | Payer: Medicare Other | Source: Ambulatory Visit | Attending: Gastroenterology | Admitting: Gastroenterology

## 2022-05-27 DIAGNOSIS — R7989 Other specified abnormal findings of blood chemistry: Secondary | ICD-10-CM | POA: Insufficient documentation

## 2022-07-02 ENCOUNTER — Encounter: Payer: Self-pay | Admitting: Gastroenterology
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
## 2022-07-03 ENCOUNTER — Encounter
Admission: RE | Disposition: A | Discharge status: Home / Self Care | Payer: Self-pay | Source: Home / Self Care | Attending: Gastroenterology

## 2022-07-03 ENCOUNTER — Encounter: Payer: Self-pay | Admitting: Gastroenterology

## 2022-07-03 ENCOUNTER — Ambulatory Visit
Admission: RE | Admit: 2022-07-03 | Discharge: 2022-07-03 | Disposition: A | Discharge status: Home / Self Care | Payer: Medicare Other | Attending: Gastroenterology | Admitting: Gastroenterology

## 2022-07-03 ENCOUNTER — Ambulatory Visit: Payer: Medicare Other | Admitting: Anesthesiology

## 2022-07-03 DIAGNOSIS — I2510 Atherosclerotic heart disease of native coronary artery without angina pectoris: Secondary | ICD-10-CM | POA: Diagnosis not present

## 2022-07-03 DIAGNOSIS — D122 Benign neoplasm of ascending colon: Secondary | ICD-10-CM | POA: Insufficient documentation

## 2022-07-03 DIAGNOSIS — Z7984 Long term (current) use of oral hypoglycemic drugs: Secondary | ICD-10-CM | POA: Insufficient documentation

## 2022-07-03 DIAGNOSIS — I509 Heart failure, unspecified: Secondary | ICD-10-CM | POA: Diagnosis not present

## 2022-07-03 DIAGNOSIS — Z6835 Body mass index (BMI) 35.0-35.9, adult: Secondary | ICD-10-CM | POA: Diagnosis not present

## 2022-07-03 DIAGNOSIS — Z1211 Encounter for screening for malignant neoplasm of colon: Secondary | ICD-10-CM | POA: Insufficient documentation

## 2022-07-03 DIAGNOSIS — E119 Type 2 diabetes mellitus without complications: Secondary | ICD-10-CM | POA: Diagnosis not present

## 2022-07-03 DIAGNOSIS — K6289 Other specified diseases of anus and rectum: Secondary | ICD-10-CM | POA: Insufficient documentation

## 2022-07-03 DIAGNOSIS — I110 Hypertensive heart disease with heart failure: Secondary | ICD-10-CM | POA: Diagnosis not present

## 2022-07-03 DIAGNOSIS — Z87891 Personal history of nicotine dependence: Secondary | ICD-10-CM | POA: Diagnosis not present

## 2022-07-03 DIAGNOSIS — K635 Polyp of colon: Secondary | ICD-10-CM | POA: Insufficient documentation

## 2022-07-03 HISTORY — PX: COLONOSCOPY WITH PROPOFOL: SHX5780

## 2022-07-03 SURGERY — COLONOSCOPY WITH PROPOFOL
Anesthesia: General

## 2022-07-03 MED ORDER — LIDOCAINE HCL (PF) 2 % IJ SOLN
INTRAMUSCULAR | Status: AC
  Filled 2022-07-03: qty 5

## 2022-07-03 MED ORDER — LIDOCAINE HCL (CARDIAC) PF 100 MG/5ML IV SOSY
PREFILLED_SYRINGE | INTRAVENOUS | Status: DC | PRN
  Administered 2022-07-03: 100 mg via INTRAVENOUS

## 2022-07-03 MED ORDER — PROPOFOL 10 MG/ML IV BOLUS
INTRAVENOUS | Status: DC | PRN
  Administered 2022-07-03: 120 ug/kg/min via INTRAVENOUS
  Administered 2022-07-03: 100 mg via INTRAVENOUS

## 2022-07-03 MED ORDER — PROPOFOL 10 MG/ML IV BOLUS
INTRAVENOUS | Status: AC
  Filled 2022-07-03: qty 20

## 2022-07-03 MED ORDER — SODIUM CHLORIDE 0.9 % IV SOLN: INTRAVENOUS | Status: DC

## 2022-07-03 NOTE — Anesthesia Postprocedure Evaluation (Signed)
Anesthesia Post Note  Patient: Jacob Rose  Procedure(s) Performed: COLONOSCOPY WITH PROPOFOL  Patient location during evaluation: PACU Anesthesia Type: General Level of consciousness: awake and awake and alert Pain management: satisfactory to patient Vital Signs Assessment: post-procedure vital signs reviewed and stable Respiratory status: spontaneous breathing and nonlabored ventilation Cardiovascular status: stable Anesthetic complications: no   No notable events documented.   Last Vitals:  Vitals:   07/03/22 1220 07/03/22 1230  BP: 117/71 (!) 143/60  Pulse:    Resp:    Temp: (!) 35.8 C   SpO2:      Last Pain:  Vitals:   07/03/22 1240  TempSrc:   PainSc: 0-No pain                 VAN STAVEREN,Breiana Stratmann

## 2022-07-03 NOTE — Op Note (Signed)
[name] Gastroenterology Patient Name: Jacob Rose Procedure Date: 07/03/2022 11:41 AM MRN: [id number] Account #: [id number] Date of Birth: 1960/09/24 Admit Type: Outpatient Age: 62 Room: [facility] ENDO ROOM 1 Gender: Male Note Status: Finalized Instrument Name: Colonoscope [id number] Procedure:             Colonoscopy Indications:           High risk colon cancer surveillance: Personal history                         of colonic polyps Providers:             Trenda Moots, DO Referring MD:          Daniel Nones, MD (Referring MD) Medicines:             Monitored Anesthesia Care Complications:         No immediate complications. Estimated blood loss:                         Minimal. Procedure:             Pre-Anesthesia Assessment:                        - Prior to the procedure, a History and Physical was                         performed, and patient medications and allergies were                         reviewed. The patient is competent. The risks and                         benefits of the procedure and the sedation options and                         risks were discussed with the patient. All questions                         were answered and informed consent was obtained.                         Patient identification and proposed procedure were                         verified by the physician, the nurse, the anesthetist                         and the technician in the endoscopy suite. Mental                         Status Examination: alert and oriented. Airway                         Examination: normal oropharyngeal airway and neck                         mobility. Respiratory Examination: clear to  auscultation. CV Examination: RRR, no murmurs, no S3                         or S4. Prophylactic Antibiotics: The patient does not                         require prophylactic antibiotics. Prior                          Anticoagulants: The patient has taken no anticoagulant                         or antiplatelet agents. ASA Grade Assessment: III - A                         patient with severe systemic disease. After reviewing                         the risks and benefits, the patient was deemed in                         satisfactory condition to undergo the procedure. The                         anesthesia plan was to use monitored anesthesia care                         (MAC). Immediately prior to administration of                         medications, the patient was re-assessed for adequacy                         to receive sedatives. The heart rate, respiratory                         rate, oxygen saturations, blood pressure, adequacy of                         pulmonary ventilation, and response to care were                         monitored throughout the procedure. The physical                         status of the patient was re-assessed after the                         procedure.                        After obtaining informed consent, the colonoscope was                         passed under direct vision. Throughout the procedure,                         the patient's blood pressure, pulse, and oxygen  saturations were monitored continuously. The                         Colonoscope was introduced through the anus and                         advanced to the the cecum, identified by appendiceal                         orifice and ileocecal valve. The colonoscopy was                         performed without difficulty. The patient tolerated                         the procedure well. The quality of the bowel                         preparation was evaluated using the BBPS [facility] Bowel                         Preparation Scale) with scores of: Right Colon = 2                         (minor amount of residual staining, small fragments of                         stool  and/or opaque liquid, but mucosa seen well),                         Transverse Colon = 3 (entire mucosa seen well with no                         residual staining, small fragments of stool or opaque                         liquid) and Left Colon = 2 (minor amount of residual                         staining, small fragments of stool and/or opaque                         liquid, but mucosa seen well). The total BBPS score                         equals 7. The quality of the bowel preparation was                         good. The ileocecal valve, appendiceal orifice, and                         rectum were photographed. Findings:      The perianal and digital rectal examinations were normal. Pertinent       negatives include normal sphincter tone.      A 1 to 2 mm polyp was found in the ascending colon. The polyp was       sessile. The  polyp was removed with a jumbo cold forceps. Resection and       retrieval were complete. Estimated blood loss was minimal.      A 3 to 4 mm polyp was found in the ascending colon. The polyp was       sessile. The polyp was removed with a cold snare. Resection and       retrieval were complete. Estimated blood loss was minimal.      Anal papilla(e) were hypertrophied. palpated on DRE as well. Estimated       blood loss: none.      The exam was otherwise without abnormality on direct and retroflexion       views. Impression:            - One 1 to 2 mm polyp in the ascending colon, removed                         with a jumbo cold forceps. Resected and retrieved.                        - One 3 to 4 mm polyp in the ascending colon, removed                         with a cold snare. Resected and retrieved.                        - Anal papilla(e) were hypertrophied.                        - The examination was otherwise normal on direct and                         retroflexion views. Recommendation:        - Patient has a contact number available for                          emergencies. The signs and symptoms of potential                         delayed complications were discussed with the patient.                         Return to normal activities tomorrow. Written                         discharge instructions were provided to the patient.                        - Discharge patient to home.                        - Resume previous diet.                        - Continue present medications.                        - Await pathology results.                        -  Repeat colonoscopy in 5 years for surveillance based                         on pathology results.                        - Return to referring physician as previously                         scheduled.                        - The findings and recommendations were discussed with                         the patient. Procedure Code(s):     --- Professional ---                        [phone], Colonoscopy, flexible; with removal of                         tumor(s), polyp(s), or other lesion(s) by snare                         technique                        [id number], 59, Colonoscopy, flexible; with biopsy, single                         or multiple Diagnosis Code(s):     --- Professional ---                        Z86.010, Personal history of colonic polyps                        D12.2, Benign neoplasm of ascending colon                        K62.89, Other specified diseases of anus and rectum CPT copyright 2022 [employer]. All rights reserved. The codes documented in this report are preliminary and upon coder review may  be revised to meet current compliance requirements. Attending Participation:      I personally performed the entire procedure. Elfredia Nevins, DO Jaynie Collins DO, DO 07/03/2022 12:20:18 PM This report has been signed electronically. Number of Addenda: 0 Note Initiated On: 07/03/2022 11:41 AM Scope Withdrawal Time: 0 hours 16 minutes 4  seconds  Total Procedure Duration: 0 hours 19 minutes 30 seconds  Estimated Blood Loss:  Estimated blood loss was minimal.      [facility]

## 2022-07-03 NOTE — Transfer of Care (Signed)
Immediate Anesthesia Transfer of Care Note  Patient: Jacob Rose  Procedure(s) Performed: COLONOSCOPY WITH PROPOFOL  Patient Location: PACU  Anesthesia Type:General  Level of Consciousness: awake, alert , and oriented  Airway & Oxygen Therapy: Patient Spontanous Breathing  Post-op Assessment: Report given to RN and Post -op Vital signs reviewed and stable  Post vital signs: Reviewed and stable  Last Vitals:  Vitals Value Taken Time  BP 117/71 07/03/22 1220  Temp 35.8 C 07/03/22 1220  Pulse 55 07/03/22 1222  Resp 18 07/03/22 1222  SpO2 99 % 07/03/22 1222  Vitals shown include unvalidated device data.  Last Pain:  Vitals:   07/03/22 1220  TempSrc: Temporal  PainSc: 0-No pain         Complications: No notable events documented.

## 2022-07-03 NOTE — Interval H&P Note (Signed)
History and Physical Interval Note: Preprocedure H&P from 07/03/22  was reviewed and there was no interval change after seeing and examining the patient.  Written consent was obtained from the patient after discussion of risks, benefits, and alternatives. Patient has consented to proceed with Colonoscopy with possible intervention   07/03/2022 11:44 AM  Jacob Rose  has presented today for surgery, with the diagnosis of V12.72 (ICD-9-CM) - Z86.010 (ICD-10-CM) - History of colon polyps.  The various methods of treatment have been discussed with the patient and family. After consideration of risks, benefits and other options for treatment, the patient has consented to  Procedure(s): COLONOSCOPY WITH PROPOFOL (N/A) as a surgical intervention.  The patient's history has been reviewed, patient examined, no change in status, stable for surgery.  I have reviewed the patient's chart and labs.  Questions were answered to the patient's satisfaction.     Jaynie Collins
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
## 2022-07-07 ENCOUNTER — Encounter: Payer: Self-pay | Admitting: Gastroenterology

## 2022-07-10 LAB — SURGICAL PATHOLOGY

## 2022-09-22 ENCOUNTER — Ambulatory Visit: Payer: Medicare Other | Admitting: Podiatry

## 2022-09-22 ENCOUNTER — Ambulatory Visit (INDEPENDENT_AMBULATORY_CARE_PROVIDER_SITE_OTHER): Payer: Medicare Other

## 2022-09-22 DIAGNOSIS — M795 Residual foreign body in soft tissue: Secondary | ICD-10-CM | POA: Diagnosis not present

## 2022-09-22 DIAGNOSIS — Z01818 Encounter for other preprocedural examination: Secondary | ICD-10-CM | POA: Diagnosis not present

## 2022-09-22 NOTE — Progress Notes (Signed)
Subjective:  Patient ID: Jacob Rose, male    DOB: 16-Mar-1960,  MRN: [id number]  Chief Complaint  Patient presents with   Foot Pain    62 y.o. male presents with the above complaint.  Patient presents with left foot needle that he stepped on a few weeks ago has progressively gotten worse more painful.  He was seen by his PCP who told him that it seems very deep and may need surgery.  He is here for consultation denies any other acute complaints.  Hurts with ambulation hurts with pressure he is a diabetic with last A1c of 6.6   Review of Systems: Negative except as noted in the HPI. Denies N/V/F/Ch.  Past Medical History:  Diagnosis Date   Arthritis    osteoarthritis   Coronary artery disease    Gout    recent outbreak 12/2017. taking prednisone briefly to treat along with allopurinol   Hypercholesteremia    Hypertension    Low testosterone in male    Shortness of breath dyspnea 12/2017   DOE   Sleep apnea    does not use cpap, had previous surgery to fix sleep apnea    Current Outpatient Medications:    allopurinol (ZYLOPRIM) 300 MG tablet, Take 300 mg by mouth at bedtime. , Disp: , Rfl:    aspirin (ASPIRIN 81) 81 MG EC tablet, Take 81 mg by mouth daily. Swallow whole., Disp: , Rfl:    atorvastatin (LIPITOR) 40 MG tablet, Take 40 mg by mouth daily., Disp: , Rfl:    lisinopril (ZESTRIL) 10 MG tablet, Take 0.5 tablets (5 mg total) by mouth at bedtime. (Patient taking differently: Take 10 mg by mouth daily.), Disp: , Rfl:    metFORMIN (GLUCOPHAGE-XR) 500 MG 24 hr tablet, Take 500 mg by mouth daily., Disp: , Rfl:    metoprolol tartrate (LOPRESSOR) 25 MG tablet, Take 0.5 tablets (12.5 mg total) by mouth 2 (two) times daily., Disp: 30 tablet, Rfl: 0  Social History   Tobacco Use  Smoking Status Former  Smokeless Tobacco Current   Types: Chew    Allergies  Allergen Reactions   Flexeril [Cyclobenzaprine] Other (See Comments)    Bad dreams   Objective:  There were no  vitals filed for this visit. There is no height or weight on file to calculate BMI. Constitutional Well developed. Well nourished.  Vascular Dorsalis pedis pulses palpable bilaterally. Posterior tibial pulses palpable bilaterally. Capillary refill normal to all digits.  No cyanosis or clubbing noted. Pedal hair growth normal.  Neurologic Normal speech. Oriented to person, place, and time. Epicritic sensation to light touch grossly present bilaterally.  Dermatologic No entry point noted.  Pain at submetatarsal 2.  No signs of infection noted no purulent drainage noted.  Orthopedic: Normal joint ROM without pain or crepitus bilaterally. No visible deformities. No bony tenderness.   Radiographs: 3 views of skeletally mature adult left foot: Foreign body noted at submetatarsal 2.  No phlegmon noted.  No signs of infection noted.  No osteomyelitis noted Assessment:   1. Residual foreign body in soft tissue   2. Encounter for preoperative examination for general surgical procedure    Plan:  Patient was evaluated and treated and all questions answered.  Left foreign body needle -All questions or concerns were discussed with the patient extensive detail -Continue antibiotics -Given the complicated nature of this foreign body with the depth I believe patient will benefit from surgical removal in the operating room given the amount of pain that  is having without any relief -He is a diabetic and high risk of developing abscess formation.  At this time I do not appreciate any erythema or clinical signs of infection -He will be nonweightbearing to the left lower extremity after the surgery -Informed surgical risk consent was reviewed and read aloud to the patient.  I reviewed the films.  I have discussed my findings with the patient in great detail.  I have discussed all risks including but not limited to infection, stiffness, scarring, limp, disability, deformity, damage to blood vessels and  nerves, numbness, poor healing, need for braces, arthritis, chronic pain, amputation, death.  All benefits and realistic expectations discussed in great detail.  I have made no promises as to the outcome.  I have provided realistic expectations.  I have offered the patient a 2nd opinion, which they have declined and assured me they preferred to proceed despite the risks   No follow-ups on file.

## 2022-09-23 ENCOUNTER — Encounter: Payer: Self-pay | Admitting: Podiatry

## 2022-09-23 ENCOUNTER — Telehealth: Payer: Self-pay | Admitting: Podiatry

## 2022-09-23 NOTE — Telephone Encounter (Signed)
DOS 09/24/2022  EXCISION FOREIGN BODY COMPLICATED - 28192  [employer] EFFECTIVE DATE 02/09/2022  PER MISTY K 2 [employer], CPT CODE 40981 DOE NOT REQUIRE PRIOR AUTHORIZATION.

## 2022-09-24 ENCOUNTER — Other Ambulatory Visit: Payer: Self-pay | Admitting: Podiatry

## 2022-09-24 DIAGNOSIS — M795 Residual foreign body in soft tissue: Secondary | ICD-10-CM

## 2022-09-24 MED ORDER — IBUPROFEN 800 MG PO TABS: 800.0000 mg | ORAL_TABLET | Freq: Four times a day (QID) | ORAL | 1 refills | Status: AC | PRN

## 2022-09-24 MED ORDER — OXYCODONE-ACETAMINOPHEN 5-325 MG PO TABS: 1.0000 | ORAL_TABLET | ORAL | 0 refills | Status: AC | PRN

## 2022-10-01 ENCOUNTER — Ambulatory Visit (INDEPENDENT_AMBULATORY_CARE_PROVIDER_SITE_OTHER): Payer: Medicare Other | Admitting: Podiatry

## 2022-10-01 DIAGNOSIS — M795 Residual foreign body in soft tissue: Secondary | ICD-10-CM

## 2022-10-01 DIAGNOSIS — Z9889 Other specified postprocedural states: Secondary | ICD-10-CM

## 2022-10-01 NOTE — Progress Notes (Signed)
  Subjective:  Patient ID: Jacob Rose, male    DOB: 03/29/1960,  MRN: [id number]  Chief Complaint  Patient presents with   Routine Post Op    DOS: 09/24/2022 Procedure: Left foreign body excision  62 y.o. male returns for post-op check.  Patient states that he is doing well.  No complaints.  Pain controlled.  Bandages clean dry and intact  Review of Systems: Negative except as noted in the HPI. Denies N/V/F/Ch.  Past Medical History:  Diagnosis Date   Arthritis    osteoarthritis   Coronary artery disease    Gout    recent outbreak 12/2017. taking prednisone briefly to treat along with allopurinol   Hypercholesteremia    Hypertension    Low testosterone in male    Shortness of breath dyspnea 12/2017   DOE   Sleep apnea    does not use cpap, had previous surgery to fix sleep apnea    Current Outpatient Medications:    allopurinol (ZYLOPRIM) 300 MG tablet, Take 300 mg by mouth at bedtime. , Disp: , Rfl:    aspirin (ASPIRIN 81) 81 MG EC tablet, Take 81 mg by mouth daily. Swallow whole., Disp: , Rfl:    atorvastatin (LIPITOR) 40 MG tablet, Take 40 mg by mouth daily., Disp: , Rfl:    ibuprofen (ADVIL) 800 MG tablet, Take 1 tablet (800 mg total) by mouth every 6 (six) hours as needed., Disp: 60 tablet, Rfl: 1   lisinopril (ZESTRIL) 10 MG tablet, Take 0.5 tablets (5 mg total) by mouth at bedtime. (Patient taking differently: Take 10 mg by mouth daily.), Disp: , Rfl:    metFORMIN (GLUCOPHAGE-XR) 500 MG 24 hr tablet, Take 500 mg by mouth daily., Disp: , Rfl:    metoprolol tartrate (LOPRESSOR) 25 MG tablet, Take 0.5 tablets (12.5 mg total) by mouth 2 (two) times daily., Disp: 30 tablet, Rfl: 0   oxyCODONE-acetaminophen (PERCOCET) 5-325 MG tablet, Take 1 tablet by mouth every 4 (four) hours as needed for severe pain., Disp: 30 tablet, Rfl: 0  Social History   Tobacco Use  Smoking Status Former  Smokeless Tobacco Current   Types: Chew    Allergies  Allergen Reactions    Flexeril [Cyclobenzaprine] Other (See Comments)    Bad dreams   Objective:  There were no vitals filed for this visit. There is no height or weight on file to calculate BMI. Constitutional Well developed. Well nourished.  Vascular Foot warm and well perfused. Capillary refill normal to all digits.   Neurologic Normal speech. Oriented to person, place, and time. Epicritic sensation to light touch grossly present bilaterally.  Dermatologic Skin healing well without signs of infection. Skin edges well coapted without signs of infection.  Orthopedic: Tenderness to palpation noted about the surgical site.   Radiographs: None Assessment:  No diagnosis found. Plan:  Patient was evaluated and treated and all questions answered.  S/p foot surgery left -Progressing as expected post-operatively. -XR: See above -WB Status: Now bearing to the left lower extremity knee scooter -Sutures: Intact.  No: No signs of dehiscence noted no complication noted. -Medications: None -Foot redressed.  No follow-ups on file.

## 2022-10-15 ENCOUNTER — Ambulatory Visit (INDEPENDENT_AMBULATORY_CARE_PROVIDER_SITE_OTHER): Payer: Medicare Other | Admitting: Podiatry

## 2022-10-15 DIAGNOSIS — M795 Residual foreign body in soft tissue: Secondary | ICD-10-CM

## 2022-10-15 NOTE — Progress Notes (Signed)
  Subjective:  Patient ID: Jacob Rose, male    DOB: [birth year],  MRN: [id number]  Chief Complaint  Patient presents with   Routine Post Op    POV # 2 DOS 09/24/22 ---LEFT FOOT FOREIGN BODY REMOVAL    DOS: 09/24/2022 Procedure: Left foreign body excision  62 y.o. male returns for post-op check.  Patient states that he is doing well.  No complaints.  Pain controlled.  Bandages clean dry and intact  Review of Systems: Negative except as noted in the HPI. Denies N/V/F/Ch.  Past Medical History:  Diagnosis Date   Arthritis    osteoarthritis   Coronary artery disease    Gout    recent outbreak 12/2017. taking prednisone briefly to treat along with allopurinol   Hypercholesteremia    Hypertension    Low testosterone in male    Shortness of breath dyspnea 12/2017   DOE   Sleep apnea    does not use cpap, had previous surgery to fix sleep apnea    Current Outpatient Medications:    allopurinol (ZYLOPRIM) 300 MG tablet, Take 300 mg by mouth at bedtime. , Disp: , Rfl:    aspirin (ASPIRIN 81) 81 MG EC tablet, Take 81 mg by mouth daily. Swallow whole., Disp: , Rfl:    atorvastatin (LIPITOR) 40 MG tablet, Take 40 mg by mouth daily., Disp: , Rfl:    ibuprofen (ADVIL) 800 MG tablet, Take 1 tablet (800 mg total) by mouth every 6 (six) hours as needed., Disp: 60 tablet, Rfl: 1   lisinopril (ZESTRIL) 10 MG tablet, Take 0.5 tablets (5 mg total) by mouth at bedtime. (Patient taking differently: Take 10 mg by mouth daily.), Disp: , Rfl:    metFORMIN (GLUCOPHAGE-XR) 500 MG 24 hr tablet, Take 500 mg by mouth daily., Disp: , Rfl:    metoprolol tartrate (LOPRESSOR) 25 MG tablet, Take 0.5 tablets (12.5 mg total) by mouth 2 (two) times daily., Disp: 30 tablet, Rfl: 0   oxyCODONE-acetaminophen (PERCOCET) 5-325 MG tablet, Take 1 tablet by mouth every 4 (four) hours as needed for severe pain., Disp: 30 tablet, Rfl: 0  Social History   Tobacco Use  Smoking Status Former  Smokeless Tobacco Current    Types: Chew    Allergies  Allergen Reactions   Flexeril [Cyclobenzaprine] Other (See Comments)    Bad dreams   Objective:  There were no vitals filed for this visit. There is no height or weight on file to calculate BMI. Constitutional Well developed. Well nourished.  Vascular Foot warm and well perfused. Capillary refill normal to all digits.   Neurologic Normal speech. Oriented to person, place, and time. Epicritic sensation to light touch grossly present bilaterally.  Dermatologic Skin completely epithelialized.  No signs of dehiscence noted no complication noted  Orthopedic: No further tenderness to palpation noted about the surgical site.   Radiographs: None Assessment:  No diagnosis found. Plan:  Patient was evaluated and treated and all questions answered.  S/p foot surgery left -Clinically healed and officially discharged from my care if any foot and ankle issues on future he will come back and see me..  No follow-ups on file.

## 2023-02-12 ENCOUNTER — Other Ambulatory Visit: Payer: Self-pay

## 2023-02-12 ENCOUNTER — Emergency Department
Admission: EM | Admit: 2023-02-12 | Discharge: 2023-02-12 | Disposition: A | Discharge status: Home / Self Care | Payer: Medicare Other | Attending: Emergency Medicine | Admitting: Emergency Medicine

## 2023-02-12 ENCOUNTER — Emergency Department: Payer: Medicare Other

## 2023-02-12 DIAGNOSIS — I2510 Atherosclerotic heart disease of native coronary artery without angina pectoris: Secondary | ICD-10-CM | POA: Diagnosis not present

## 2023-02-12 DIAGNOSIS — I10 Essential (primary) hypertension: Secondary | ICD-10-CM | POA: Diagnosis not present

## 2023-02-12 DIAGNOSIS — R0789 Other chest pain: Secondary | ICD-10-CM | POA: Insufficient documentation

## 2023-02-12 DIAGNOSIS — R079 Chest pain, unspecified: Secondary | ICD-10-CM | POA: Diagnosis present

## 2023-02-12 DIAGNOSIS — Z7982 Long term (current) use of aspirin: Secondary | ICD-10-CM | POA: Diagnosis not present

## 2023-02-12 DIAGNOSIS — Z96612 Presence of left artificial shoulder joint: Secondary | ICD-10-CM | POA: Diagnosis not present

## 2023-02-12 DIAGNOSIS — Z79899 Other long term (current) drug therapy: Secondary | ICD-10-CM | POA: Diagnosis not present

## 2023-02-12 LAB — CBC
HCT: 38.4 % — ABNORMAL LOW (ref 39.0–52.0)
Hemoglobin: 13.6 g/dL (ref 13.0–17.0)
MCH: 33 pg (ref 26.0–34.0)
MCHC: 35.4 g/dL (ref 30.0–36.0)
MCV: 93.2 fL (ref 80.0–100.0)
Platelets: 209 10*3/uL (ref 150–400)
RBC: 4.12 MIL/uL — ABNORMAL LOW (ref 4.22–5.81)
RDW: 12.6 % (ref 11.5–15.5)
WBC: 11.5 10*3/uL — ABNORMAL HIGH (ref 4.0–10.5)
nRBC: 0 % (ref 0.0–0.2)

## 2023-02-12 LAB — COMPREHENSIVE METABOLIC PANEL
ALT: 53 U/L — ABNORMAL HIGH (ref 0–44)
AST: 69 U/L — ABNORMAL HIGH (ref 15–41)
Albumin: 4.3 g/dL (ref 3.5–5.0)
Alkaline Phosphatase: 103 U/L (ref 38–126)
Anion gap: 10 (ref 5–15)
BUN: 20 mg/dL (ref 8–23)
CO2: 24 mmol/L (ref 22–32)
Calcium: 9.2 mg/dL (ref 8.9–10.3)
Chloride: 103 mmol/L (ref 98–111)
Creatinine, Ser: 0.97 mg/dL (ref 0.61–1.24)
GFR, Estimated: >60 mL/min (ref >60)
Glucose, Bld: 121 mg/dL — ABNORMAL HIGH (ref 70–99)
Potassium: 3.9 mmol/L (ref 3.5–5.1)
Sodium: 137 mmol/L (ref 135–145)
Total Bilirubin: 1.5 mg/dL — ABNORMAL HIGH (ref 0.0–1.2)
Total Protein: 7.7 g/dL (ref 6.5–8.1)

## 2023-02-12 LAB — TROPONIN I (HIGH SENSITIVITY)
Troponin I (High Sensitivity): 4 ng/L (ref <18)
Troponin I (High Sensitivity): 5 ng/L (ref <18)

## 2023-02-12 MED ORDER — KETOROLAC TROMETHAMINE 30 MG/ML IJ SOLN
30.0000 mg | Freq: Once | INTRAMUSCULAR | Status: AC
  Administered 2023-02-12: 30 mg via INTRAVENOUS
  Filled 2023-02-12: qty 1
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
## 2023-02-12 NOTE — ED Triage Notes (Signed)
 EMS brings pt in from home for c/o pain to left side neck radiating down since last night, worse with movement

## 2023-02-12 NOTE — ED Notes (Signed)
Patient discharged at this time. Ambulated to lobby with independent and steady gait. Breathing unlabored speaking in full sentences. Verbalized understanding of all discharge, follow up, and medication teaching. Discharged homed with all belongings.   

## 2023-02-12 NOTE — ED Triage Notes (Signed)
 Pt reports acute onset L side chest, back, neck, and shoulder pain. States woke pt up from sleeping at 2315. Pt took 324 asa at home pta. Pt alert and oriented. Breathing unlabored and speaking in full sentences. Symmetric chest rise and fall noted

## 2023-02-12 NOTE — Discharge Instructions (Addendum)
 Your cardiac labs were reassuring today.  Your EKG showed no concerning changes.  Your chest x-ray was clear.  Your pain seems to be more musculoskeletal in nature.  You may alternate Tylenol , ibuprofen  over-the-counter for pain control.  I recommend close follow-up with your PCP if symptoms not improving.

## 2023-02-18 ENCOUNTER — Other Ambulatory Visit: Payer: Self-pay | Admitting: Cardiology

## 2023-02-18 DIAGNOSIS — R0789 Other chest pain: Secondary | ICD-10-CM

## 2023-02-18 NOTE — Progress Notes (Signed)
 PET/CT stress test consent

## 2023-02-19 ENCOUNTER — Other Ambulatory Visit: Payer: Self-pay | Admitting: Cardiology

## 2023-02-19 DIAGNOSIS — R0602 Shortness of breath: Secondary | ICD-10-CM

## 2023-02-19 DIAGNOSIS — E1165 Type 2 diabetes mellitus with hyperglycemia: Secondary | ICD-10-CM

## 2023-02-19 DIAGNOSIS — R0789 Other chest pain: Secondary | ICD-10-CM

## 2023-03-23 ENCOUNTER — Encounter (HOSPITAL_COMMUNITY): Payer: Self-pay

## 2023-03-24 ENCOUNTER — Telehealth (HOSPITAL_COMMUNITY): Payer: Self-pay | Admitting: *Deleted

## 2023-03-24 NOTE — Telephone Encounter (Signed)
Reaching out to patient to offer assistance regarding upcoming cardiac imaging study; pt verbalizes understanding of appt date/time, parking situation and where to check in, pre-test NPO status, and verified current allergies; name and call back number provided for further questions should they arise  Larey Brick RN Navigator Cardiac Imaging [facility] [phone] office [phone] cell  Patient aware to avoid caffeine 12 hours prior to his cardiac PET scan.

## 2023-03-25 ENCOUNTER — Ambulatory Visit
Admission: RE | Admit: 2023-03-25 | Discharge: 2023-03-25 | Disposition: A | Discharge status: Home / Self Care | Payer: Medicare Other | Source: Ambulatory Visit | Attending: Cardiology | Admitting: Cardiology

## 2023-03-25 DIAGNOSIS — R0789 Other chest pain: Secondary | ICD-10-CM

## 2023-03-25 DIAGNOSIS — E1165 Type 2 diabetes mellitus with hyperglycemia: Secondary | ICD-10-CM

## 2023-03-25 DIAGNOSIS — R0602 Shortness of breath: Secondary | ICD-10-CM

## 2023-04-07 ENCOUNTER — Telehealth (HOSPITAL_COMMUNITY): Payer: Self-pay | Admitting: *Deleted

## 2023-04-07 NOTE — Telephone Encounter (Signed)

## 2023-04-08 ENCOUNTER — Ambulatory Visit
Admission: RE | Admit: 2023-04-08 | Discharge: 2023-04-08 | Disposition: A | Discharge status: Home / Self Care | Payer: Medicare Other | Source: Ambulatory Visit | Attending: Cardiology | Admitting: Cardiology

## 2023-04-08 DIAGNOSIS — I2510 Atherosclerotic heart disease of native coronary artery without angina pectoris: Secondary | ICD-10-CM | POA: Insufficient documentation

## 2023-04-08 DIAGNOSIS — R0602 Shortness of breath: Secondary | ICD-10-CM | POA: Diagnosis not present

## 2023-04-08 DIAGNOSIS — E1165 Type 2 diabetes mellitus with hyperglycemia: Secondary | ICD-10-CM | POA: Insufficient documentation

## 2023-04-08 DIAGNOSIS — R0789 Other chest pain: Secondary | ICD-10-CM | POA: Diagnosis not present

## 2023-04-08 DIAGNOSIS — I517 Cardiomegaly: Secondary | ICD-10-CM | POA: Insufficient documentation

## 2023-04-08 DIAGNOSIS — I700 Atherosclerosis of aorta: Secondary | ICD-10-CM | POA: Insufficient documentation

## 2023-04-08 MED ORDER — RUBIDIUM RB82 GENERATOR (RUBYFILL)
25.0000 | PACK | Freq: Once | INTRAVENOUS | Status: AC
  Administered 2023-04-08: 24.04 via INTRAVENOUS

## 2023-04-08 MED ORDER — REGADENOSON 0.4 MG/5ML IV SOLN
0.4000 mg | Freq: Once | INTRAVENOUS | Status: AC
  Administered 2023-04-08: 0.4 mg via INTRAVENOUS
  Filled 2023-04-08: qty 5

## 2023-04-08 MED ORDER — REGADENOSON 0.4 MG/5ML IV SOLN
INTRAVENOUS | Status: AC
  Filled 2023-04-08: qty 5

## 2023-04-08 MED ORDER — RUBIDIUM RB82 GENERATOR (RUBYFILL)
25.0000 | PACK | Freq: Once | INTRAVENOUS | Status: AC
  Administered 2023-04-08: 25.08 via INTRAVENOUS

## 2023-04-08 NOTE — Progress Notes (Signed)
 Patient presents for a cardiac PET stress test and tolerated procedure without incident. Patient maintained acceptable vital signs throughout the test and was offered caffeine after test.  Patient ambulated out of department with a steady gait.

## 2023-04-09 LAB — NM PET CT CARDIAC PERFUSION MULTI W/ABSOLUTE BLOODFLOW
LV dias vol: 125 mL (ref 62–150)
LV sys vol: 57 mL
MBFR: 1.74
Nuc Rest EF: 51 %
Nuc Stress EF: 54 %
Peak HR: 84 {beats}/min
Rest HR: 70 {beats}/min
Rest MBF: 0.77 ml/g/min
Rest Nuclear Isotope Dose: 25.1 mCi
SRS: 0
SSS: 1
ST Depression (mm): 0 mm
Stress MBF: 1.34 ml/g/min
Stress Nuclear Isotope Dose: 24 mCi
TID: 1.01
